# Patient Record
Sex: Female | Born: 2000 | Race: Black or African American | Hispanic: No | Marital: Single | Smoking: Never smoker
Health system: Southern US, Community
[De-identification: ages and names within clinical notes are randomized; demographics above are authoritative.]

## PROBLEM LIST (undated history)

## (undated) DIAGNOSIS — B9689 Other specified bacterial agents as the cause of diseases classified elsewhere: Secondary | ICD-10-CM

## (undated) DIAGNOSIS — L732 Hidradenitis suppurativa: Secondary | ICD-10-CM

## (undated) DIAGNOSIS — N760 Acute vaginitis: Secondary | ICD-10-CM

---

## 2018-08-31 ENCOUNTER — Encounter (HOSPITAL_COMMUNITY)
Admission: EM | Disposition: A | Discharge status: Home / Self Care | Payer: Self-pay | Source: Home / Self Care | Attending: Emergency Medicine

## 2018-08-31 ENCOUNTER — Observation Stay (HOSPITAL_COMMUNITY)
Admission: EM | Admit: 2018-08-31 | Discharge: 2018-09-01 | Disposition: A | Discharge status: Home / Self Care | Payer: BLUE CROSS/BLUE SHIELD | Attending: Orthopaedic Surgery | Admitting: Orthopaedic Surgery

## 2018-08-31 ENCOUNTER — Observation Stay (HOSPITAL_COMMUNITY): Payer: BLUE CROSS/BLUE SHIELD | Admitting: Anesthesiology

## 2018-08-31 ENCOUNTER — Other Ambulatory Visit: Payer: Self-pay

## 2018-08-31 ENCOUNTER — Emergency Department (HOSPITAL_COMMUNITY): Payer: BLUE CROSS/BLUE SHIELD

## 2018-08-31 ENCOUNTER — Encounter (HOSPITAL_COMMUNITY): Payer: Self-pay | Admitting: Emergency Medicine

## 2018-08-31 DIAGNOSIS — S86022A Laceration of left Achilles tendon, initial encounter: Secondary | ICD-10-CM | POA: Diagnosis present

## 2018-08-31 DIAGNOSIS — Y92214 College as the place of occurrence of the external cause: Secondary | ICD-10-CM | POA: Diagnosis not present

## 2018-08-31 DIAGNOSIS — S81802A Unspecified open wound, left lower leg, initial encounter: Secondary | ICD-10-CM

## 2018-08-31 DIAGNOSIS — Z23 Encounter for immunization: Secondary | ICD-10-CM | POA: Insufficient documentation

## 2018-08-31 DIAGNOSIS — S86012A Strain of left Achilles tendon, initial encounter: Secondary | ICD-10-CM | POA: Diagnosis present

## 2018-08-31 DIAGNOSIS — S86009A Unspecified injury of unspecified Achilles tendon, initial encounter: Secondary | ICD-10-CM | POA: Diagnosis present

## 2018-08-31 HISTORY — PX: WOUND EXPLORATION: SHX6188

## 2018-08-31 HISTORY — DX: Other specified bacterial agents as the cause of diseases classified elsewhere: N76.0

## 2018-08-31 HISTORY — PX: ACHILLES TENDON SURGERY: SHX542

## 2018-08-31 HISTORY — DX: Other specified bacterial agents as the cause of diseases classified elsewhere: B96.89

## 2018-08-31 HISTORY — PX: I & D EXTREMITY: SHX5045

## 2018-08-31 LAB — BASIC METABOLIC PANEL
Anion gap: 10 (ref 5–15)
BUN: 13 mg/dL (ref 6–20)
CALCIUM: 9.2 mg/dL (ref 8.9–10.3)
CO2: 23 mmol/L (ref 22–32)
Chloride: 104 mmol/L (ref 98–111)
Creatinine, Ser: 0.96 mg/dL (ref 0.44–1.00)
GFR calc Af Amer: >60 mL/min (ref >60)
GFR calc non Af Amer: >60 mL/min (ref >60)
Glucose, Bld: 104 mg/dL — ABNORMAL HIGH (ref 70–99)
Potassium: 4.1 mmol/L (ref 3.5–5.1)
Sodium: 137 mmol/L (ref 135–145)

## 2018-08-31 LAB — I-STAT BETA HCG BLOOD, ED (MC, WL, AP ONLY): I-stat hCG, quantitative: <5 m[IU]/mL (ref <5)

## 2018-08-31 LAB — CBC
HCT: 32.1 % — ABNORMAL LOW (ref 36.0–46.0)
HEMOGLOBIN: 10.1 g/dL — AB (ref 12.0–15.0)
MCH: 23.9 pg — AB (ref 26.0–34.0)
MCHC: 31.5 g/dL (ref 30.0–36.0)
MCV: 75.9 fL — ABNORMAL LOW (ref 78.0–100.0)
Platelets: 324 10*3/uL (ref 150–400)
RBC: 4.23 MIL/uL (ref 3.87–5.11)
RDW: 17.2 % — AB (ref 11.5–15.5)
WBC: 6.4 10*3/uL (ref 4.0–10.5)

## 2018-08-31 LAB — MRSA PCR SCREENING: MRSA BY PCR: NEGATIVE

## 2018-08-31 SURGERY — IRRIGATION AND DEBRIDEMENT EXTREMITY
Anesthesia: General | Laterality: Left

## 2018-08-31 MED ORDER — CEFAZOLIN SODIUM-DEXTROSE 1-4 GM/50ML-% IV SOLN
1.0000 g | Freq: Three times a day (TID) | INTRAVENOUS | Status: DC
  Administered 2018-08-31: 2 g via INTRAVENOUS

## 2018-08-31 MED ORDER — OXYCODONE HCL 5 MG PO TABS
10.0000 mg | ORAL_TABLET | ORAL | Status: DC | PRN
  Administered 2018-09-01 (×2): 10 mg via ORAL

## 2018-08-31 MED ORDER — ROCURONIUM BROMIDE 50 MG/5ML IV SOSY
PREFILLED_SYRINGE | INTRAVENOUS | Status: AC
  Filled 2018-08-31: qty 10

## 2018-08-31 MED ORDER — CHLORHEXIDINE GLUCONATE 4 % EX LIQD: 60.0000 mL | Freq: Once | CUTANEOUS | Status: DC

## 2018-08-31 MED ORDER — ONDANSETRON HCL 4 MG PO TABS: 4.0000 mg | ORAL_TABLET | Freq: Four times a day (QID) | ORAL | Status: DC | PRN

## 2018-08-31 MED ORDER — BUPIVACAINE-EPINEPHRINE (PF) 0.5% -1:200000 IJ SOLN
INTRAMUSCULAR | Status: DC | PRN
  Administered 2018-08-31: 30 mL via PERINEURAL

## 2018-08-31 MED ORDER — PROPOFOL 10 MG/ML IV BOLUS
INTRAVENOUS | Status: DC | PRN
  Administered 2018-08-31: 200 mg via INTRAVENOUS

## 2018-08-31 MED ORDER — ROCURONIUM BROMIDE 50 MG/5ML IV SOSY
PREFILLED_SYRINGE | INTRAVENOUS | Status: DC | PRN
  Administered 2018-08-31: 50 mg via INTRAVENOUS

## 2018-08-31 MED ORDER — DOCUSATE SODIUM 100 MG PO CAPS
100.0000 mg | ORAL_CAPSULE | Freq: Two times a day (BID) | ORAL | Status: DC
  Administered 2018-08-31 – 2018-09-01 (×2): 100 mg via ORAL
  Filled 2018-08-31 (×2): qty 1

## 2018-08-31 MED ORDER — ONDANSETRON HCL 4 MG/2ML IJ SOLN
INTRAMUSCULAR | Status: DC | PRN
  Administered 2018-08-31: 4 mg via INTRAVENOUS

## 2018-08-31 MED ORDER — OXYCODONE HCL 5 MG PO TABS
5.0000 mg | ORAL_TABLET | ORAL | Status: DC | PRN
  Filled 2018-08-31: qty 2

## 2018-08-31 MED ORDER — GLYCOPYRROLATE PF 0.2 MG/ML IJ SOSY
PREFILLED_SYRINGE | INTRAMUSCULAR | Status: AC
  Filled 2018-08-31: qty 2

## 2018-08-31 MED ORDER — SUGAMMADEX SODIUM 200 MG/2ML IV SOLN
INTRAVENOUS | Status: DC | PRN
  Administered 2018-08-31: 200 mg via INTRAVENOUS

## 2018-08-31 MED ORDER — FENTANYL CITRATE (PF) 250 MCG/5ML IJ SOLN
INTRAMUSCULAR | Status: AC
  Filled 2018-08-31: qty 5

## 2018-08-31 MED ORDER — DOXYCYCLINE HYCLATE 100 MG PO TABS: 100.0000 mg | ORAL_TABLET | Freq: Two times a day (BID) | ORAL | 0 refills | Status: DC

## 2018-08-31 MED ORDER — LACTATED RINGERS IV SOLN
INTRAVENOUS | Status: DC
  Administered 2018-08-31: 10:00:00 via INTRAVENOUS

## 2018-08-31 MED ORDER — CEFAZOLIN SODIUM-DEXTROSE 2-4 GM/100ML-% IV SOLN
2.0000 g | INTRAVENOUS | Status: DC
  Filled 2018-08-31: qty 100

## 2018-08-31 MED ORDER — HYDROMORPHONE HCL 1 MG/ML IJ SOLN: 0.5000 mg | INTRAMUSCULAR | Status: DC | PRN

## 2018-08-31 MED ORDER — ONDANSETRON HCL 4 MG/2ML IJ SOLN
4.0000 mg | Freq: Four times a day (QID) | INTRAMUSCULAR | Status: DC | PRN
  Administered 2018-08-31: 4 mg via INTRAVENOUS
  Filled 2018-08-31: qty 2

## 2018-08-31 MED ORDER — TRIAMCINOLONE ACETONIDE 0.1 % EX CREA
TOPICAL_CREAM | Freq: Every day | CUTANEOUS | Status: DC
  Filled 2018-08-31: qty 15

## 2018-08-31 MED ORDER — ONDANSETRON HCL 4 MG/2ML IJ SOLN
INTRAMUSCULAR | Status: AC
  Filled 2018-08-31: qty 4

## 2018-08-31 MED ORDER — MIDAZOLAM HCL 5 MG/5ML IJ SOLN
INTRAMUSCULAR | Status: DC | PRN
  Administered 2018-08-31: 2 mg via INTRAVENOUS

## 2018-08-31 MED ORDER — ENOXAPARIN SODIUM 40 MG/0.4ML ~~LOC~~ SOLN: 40.0000 mg | SUBCUTANEOUS | Status: DC

## 2018-08-31 MED ORDER — METHOCARBAMOL 1000 MG/10ML IJ SOLN: 500.0000 mg | Freq: Four times a day (QID) | INTRAVENOUS | Status: DC | PRN

## 2018-08-31 MED ORDER — SODIUM CHLORIDE 0.9 % IV SOLN
INTRAVENOUS | Status: DC
  Administered 2018-08-31: 18:00:00 via INTRAVENOUS

## 2018-08-31 MED ORDER — ACETAMINOPHEN 325 MG PO TABS: 650.0000 mg | ORAL_TABLET | Freq: Four times a day (QID) | ORAL | Status: DC | PRN

## 2018-08-31 MED ORDER — NAPROXEN 500 MG PO TABS: 500.0000 mg | ORAL_TABLET | Freq: Two times a day (BID) | ORAL | 2 refills | Status: AC | PRN

## 2018-08-31 MED ORDER — POVIDONE-IODINE 10 % EX SWAB: 2.0000 "application " | Freq: Once | CUTANEOUS | Status: DC

## 2018-08-31 MED ORDER — METHOCARBAMOL 1000 MG/10ML IJ SOLN
500.0000 mg | Freq: Four times a day (QID) | INTRAVENOUS | Status: DC | PRN
  Filled 2018-08-31: qty 5

## 2018-08-31 MED ORDER — MIDAZOLAM HCL 2 MG/2ML IJ SOLN
INTRAMUSCULAR | Status: AC
  Filled 2018-08-31: qty 2

## 2018-08-31 MED ORDER — LACTATED RINGERS IV SOLN
INTRAVENOUS | Status: DC | PRN
  Administered 2018-08-31 (×2): via INTRAVENOUS

## 2018-08-31 MED ORDER — ARTIFICIAL TEARS OPHTHALMIC OINT
TOPICAL_OINTMENT | OPHTHALMIC | Status: AC
  Filled 2018-08-31: qty 3.5

## 2018-08-31 MED ORDER — FENTANYL CITRATE (PF) 100 MCG/2ML IJ SOLN
INTRAMUSCULAR | Status: DC | PRN
  Administered 2018-08-31 (×3): 50 ug via INTRAVENOUS

## 2018-08-31 MED ORDER — SODIUM CHLORIDE 0.9 % IR SOLN
Status: DC | PRN
  Administered 2018-08-31: 1000 mL

## 2018-08-31 MED ORDER — SODIUM CHLORIDE 0.9 % IV SOLN
INTRAVENOUS | Status: DC
  Administered 2018-08-31: 06:00:00 via INTRAVENOUS

## 2018-08-31 MED ORDER — DEXAMETHASONE SODIUM PHOSPHATE 10 MG/ML IJ SOLN
INTRAMUSCULAR | Status: DC | PRN
  Administered 2018-08-31: 10 mg via INTRAVENOUS

## 2018-08-31 MED ORDER — CEFAZOLIN SODIUM-DEXTROSE 1-4 GM/50ML-% IV SOLN
1.0000 g | Freq: Four times a day (QID) | INTRAVENOUS | Status: AC
  Administered 2018-08-31 – 2018-09-01 (×3): 1 g via INTRAVENOUS
  Filled 2018-08-31 (×3): qty 50

## 2018-08-31 MED ORDER — TETANUS-DIPHTH-ACELL PERTUSSIS 5-2.5-18.5 LF-MCG/0.5 IM SUSP
0.5000 mL | Freq: Once | INTRAMUSCULAR | Status: AC
  Administered 2018-08-31: 0.5 mL via INTRAMUSCULAR
  Filled 2018-08-31: qty 0.5

## 2018-08-31 MED ORDER — METHOCARBAMOL 500 MG PO TABS: 500.0000 mg | ORAL_TABLET | Freq: Four times a day (QID) | ORAL | Status: DC | PRN

## 2018-08-31 MED ORDER — ONDANSETRON HCL 4 MG/2ML IJ SOLN: 4.0000 mg | Freq: Four times a day (QID) | INTRAMUSCULAR | Status: DC | PRN

## 2018-08-31 MED ORDER — METOCLOPRAMIDE HCL 5 MG/ML IJ SOLN: 5.0000 mg | Freq: Three times a day (TID) | INTRAMUSCULAR | Status: DC | PRN

## 2018-08-31 MED ORDER — METHOCARBAMOL 500 MG PO TABS
500.0000 mg | ORAL_TABLET | Freq: Four times a day (QID) | ORAL | Status: DC | PRN
  Administered 2018-08-31 – 2018-09-01 (×2): 500 mg via ORAL
  Filled 2018-08-31 (×2): qty 1

## 2018-08-31 MED ORDER — MORPHINE SULFATE (PF) 2 MG/ML IV SOLN: 2.0000 mg | INTRAVENOUS | Status: DC | PRN

## 2018-08-31 MED ORDER — ACETAMINOPHEN 325 MG PO TABS: 325.0000 mg | ORAL_TABLET | Freq: Four times a day (QID) | ORAL | Status: DC | PRN

## 2018-08-31 MED ORDER — ACETAMINOPHEN 650 MG RE SUPP: 650.0000 mg | Freq: Four times a day (QID) | RECTAL | Status: DC | PRN

## 2018-08-31 MED ORDER — PROPOFOL 10 MG/ML IV BOLUS
INTRAVENOUS | Status: AC
  Filled 2018-08-31: qty 20

## 2018-08-31 MED ORDER — HYDROCODONE-ACETAMINOPHEN 5-325 MG PO TABS: 1.0000 | ORAL_TABLET | ORAL | 0 refills | Status: DC | PRN

## 2018-08-31 MED ORDER — DEXAMETHASONE SODIUM PHOSPHATE 10 MG/ML IJ SOLN
INTRAMUSCULAR | Status: AC
  Filled 2018-08-31: qty 2

## 2018-08-31 MED ORDER — FENTANYL CITRATE (PF) 100 MCG/2ML IJ SOLN
50.0000 ug | INTRAMUSCULAR | Status: DC | PRN
  Administered 2018-08-31: 50 ug via INTRAVENOUS
  Filled 2018-08-31: qty 2

## 2018-08-31 MED ORDER — CEFAZOLIN SODIUM-DEXTROSE 2-4 GM/100ML-% IV SOLN
2.0000 g | Freq: Once | INTRAVENOUS | Status: AC
  Administered 2018-08-31: 2 g via INTRAVENOUS
  Filled 2018-08-31: qty 100

## 2018-08-31 MED ORDER — NEOSTIGMINE METHYLSULFATE 3 MG/3ML IV SOSY
PREFILLED_SYRINGE | INTRAVENOUS | Status: AC
  Filled 2018-08-31: qty 3

## 2018-08-31 MED ORDER — FENTANYL CITRATE (PF) 100 MCG/2ML IJ SOLN
INTRAMUSCULAR | Status: AC
  Filled 2018-08-31: qty 2

## 2018-08-31 MED ORDER — METOCLOPRAMIDE HCL 5 MG PO TABS: 5.0000 mg | ORAL_TABLET | Freq: Three times a day (TID) | ORAL | Status: DC | PRN

## 2018-08-31 MED ORDER — DIPHENHYDRAMINE HCL 12.5 MG/5ML PO ELIX: 12.5000 mg | ORAL_SOLUTION | ORAL | Status: DC | PRN

## 2018-08-31 SURGICAL SUPPLY — 62 items
BANDAGE ACE 3X5.8 VEL STRL LF (GAUZE/BANDAGES/DRESSINGS) IMPLANT
BANDAGE ACE 6X5 VEL STRL LF (GAUZE/BANDAGES/DRESSINGS) ×3 IMPLANT
BLADE SURG 10 STRL SS (BLADE) ×3 IMPLANT
BNDG COHESIVE 4X5 TAN STRL (GAUZE/BANDAGES/DRESSINGS) ×3 IMPLANT
BNDG COHESIVE 6X5 TAN STRL LF (GAUZE/BANDAGES/DRESSINGS) ×3 IMPLANT
COVER SURGICAL LIGHT HANDLE (MISCELLANEOUS) ×3 IMPLANT
CUFF TOURNIQUET SINGLE 18IN (TOURNIQUET CUFF) IMPLANT
CUFF TOURNIQUET SINGLE 34IN LL (TOURNIQUET CUFF) IMPLANT
DRAPE ORTHO SPLIT 77X108 STRL (DRAPES) ×4
DRAPE SURG 17X23 STRL (DRAPES) IMPLANT
DRAPE SURG ORHT 6 SPLT 77X108 (DRAPES) ×2 IMPLANT
DRAPE U-SHAPE 47X51 STRL (DRAPES) ×3 IMPLANT
DURAPREP 26ML APPLICATOR (WOUND CARE) ×3 IMPLANT
ELECT REM PT RETURN 9FT ADLT (ELECTROSURGICAL)
ELECTRODE REM PT RTRN 9FT ADLT (ELECTROSURGICAL) IMPLANT
GAUZE SPONGE 4X4 12PLY STRL (GAUZE/BANDAGES/DRESSINGS) IMPLANT
GAUZE SPONGE 4X4 12PLY STRL LF (GAUZE/BANDAGES/DRESSINGS) ×3 IMPLANT
GAUZE XEROFORM 1X8 LF (GAUZE/BANDAGES/DRESSINGS) IMPLANT
GAUZE XEROFORM 5X9 LF (GAUZE/BANDAGES/DRESSINGS) ×3 IMPLANT
GLOVE BIO SURGEON STRL SZ8 (GLOVE) ×3 IMPLANT
GLOVE BIOGEL PI IND STRL 6.5 (GLOVE) ×1 IMPLANT
GLOVE BIOGEL PI IND STRL 8 (GLOVE) ×2 IMPLANT
GLOVE BIOGEL PI INDICATOR 6.5 (GLOVE) ×2
GLOVE BIOGEL PI INDICATOR 8 (GLOVE) ×4
GLOVE ORTHO TXT STRL SZ7.5 (GLOVE) ×3 IMPLANT
GLOVE SURG SS PI 6.5 STRL IVOR (GLOVE) ×3 IMPLANT
GOWN STRL REUS W/ TWL LRG LVL3 (GOWN DISPOSABLE) ×2 IMPLANT
GOWN STRL REUS W/ TWL XL LVL3 (GOWN DISPOSABLE) ×2 IMPLANT
GOWN STRL REUS W/TWL LRG LVL3 (GOWN DISPOSABLE) ×4
GOWN STRL REUS W/TWL XL LVL3 (GOWN DISPOSABLE) ×4
HANDPIECE INTERPULSE COAX TIP (DISPOSABLE)
KIT BASIN OR (CUSTOM PROCEDURE TRAY) ×3 IMPLANT
KIT TURNOVER KIT B (KITS) ×3 IMPLANT
MANIFOLD NEPTUNE II (INSTRUMENTS) ×3 IMPLANT
NS IRRIG 1000ML POUR BTL (IV SOLUTION) ×3 IMPLANT
PACK ORTHO EXTREMITY (CUSTOM PROCEDURE TRAY) ×3 IMPLANT
PAD ABD 8X10 STRL (GAUZE/BANDAGES/DRESSINGS) ×6 IMPLANT
PAD ARMBOARD 7.5X6 YLW CONV (MISCELLANEOUS) ×6 IMPLANT
PADDING CAST ABS 4INX4YD NS (CAST SUPPLIES) ×2
PADDING CAST ABS COTTON 4X4 ST (CAST SUPPLIES) ×1 IMPLANT
PADDING CAST COTTON 6X4 STRL (CAST SUPPLIES) ×3 IMPLANT
SET HNDPC FAN SPRY TIP SCT (DISPOSABLE) IMPLANT
SPLINT PLASTER CAST XFAST 5X30 (CAST SUPPLIES) ×1 IMPLANT
SPLINT PLASTER XFAST SET 5X30 (CAST SUPPLIES) ×2
SPONGE LAP 18X18 X RAY DECT (DISPOSABLE) ×3 IMPLANT
STOCKINETTE IMPERVIOUS 9X36 MD (GAUZE/BANDAGES/DRESSINGS) ×3 IMPLANT
SUT ETHILON 2 0 FS 18 (SUTURE) IMPLANT
SUT ETHILON 2 0 PSLX (SUTURE) ×9 IMPLANT
SUT ETHILON 3 0 PS 1 (SUTURE) IMPLANT
SUT FIBERWIRE #2 38 T-5 BLUE (SUTURE) ×3
SUT VIC AB 0 CT1 27 (SUTURE) ×2
SUT VIC AB 0 CT1 27XBRD ANBCTR (SUTURE) ×1 IMPLANT
SUT VIC AB 1 CT1 27 (SUTURE) ×2
SUT VIC AB 1 CT1 27XBRD ANBCTR (SUTURE) ×1 IMPLANT
SUTURE FIBERWR #2 38 T-5 BLUE (SUTURE) ×1 IMPLANT
SWAB CULTURE ESWAB REG 1ML (MISCELLANEOUS) IMPLANT
TOWEL OR 17X24 6PK STRL BLUE (TOWEL DISPOSABLE) ×3 IMPLANT
TOWEL OR 17X26 10 PK STRL BLUE (TOWEL DISPOSABLE) ×3 IMPLANT
TUBE CONNECTING 12'X1/4 (SUCTIONS) ×1
TUBE CONNECTING 12X1/4 (SUCTIONS) ×2 IMPLANT
UNDERPAD 30X30 (UNDERPADS AND DIAPERS) ×3 IMPLANT
YANKAUER SUCT BULB TIP NO VENT (SUCTIONS) ×3 IMPLANT

## 2018-08-31 NOTE — Transfer of Care (Signed)
Immediate Anesthesia Transfer of Care Note  Patient: Pamela Arias  Procedure(s) Performed: IRRIGATION AND DEBRIDEMENT LEFT LOWER POSTERIOR  LEG (Left ) ACHILLES TENDON REPAIR LEFT (Left ) WOUND EXPLORATION LEFT LOWER POSTERIOR  LEG (Left )  Patient Location: PACU  Anesthesia Type:General  Level of Consciousness: awake, alert  and oriented  Airway & Oxygen Therapy: Patient Spontanous Breathing and Patient connected to nasal cannula oxygen  Post-op Assessment: Report given to RN, Post -op Vital signs reviewed and stable and Patient moving all extremities X 4  Post vital signs: Reviewed and stable  Last Vitals:  Vitals Value Taken Time  BP 115/80 08/31/2018  1:11 PM  Temp    Pulse 87 08/31/2018  1:12 PM  Resp 12 08/31/2018  1:12 PM  SpO2 100 % 08/31/2018  1:12 PM  Vitals shown include unvalidated device data.  Last Pain:  Vitals:   08/31/18 0653  TempSrc:   PainSc: Asleep         Complications: No apparent anesthesia complications

## 2018-08-31 NOTE — Progress Notes (Signed)
Patient ID: Pamela Arias, female   DOB: 10/31/2001, 17 y.o.   MRN: [id number] Dr.Chris Magnus IvanBlackman to perform surgery at 11 AM .

## 2018-08-31 NOTE — Discharge Instructions (Signed)
No weight at all on your left ankle/foot. Keep your splint clean and dry.
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
## 2018-08-31 NOTE — Anesthesia Postprocedure Evaluation (Signed)
Anesthesia Post Note  Patient: Tax inspectorMaryanne Arias  Procedure(s) Performed: IRRIGATION AND DEBRIDEMENT LEFT LOWER POSTERIOR  LEG (Left ) ACHILLES TENDON REPAIR LEFT (Left ) WOUND EXPLORATION LEFT LOWER POSTERIOR  LEG (Left )     Patient location during evaluation: PACU Anesthesia Type: General Level of consciousness: sedated Pain management: pain level controlled Vital Signs Assessment: post-procedure vital signs reviewed and stable Respiratory status: spontaneous breathing and respiratory function stable Cardiovascular status: stable Postop Assessment: no apparent nausea or vomiting Anesthetic complications: no    Last Vitals:  Vitals:   08/31/18 1410 08/31/18 1433  BP: 126/85 130/87  Pulse: 64 65  Resp: 13 16  Temp: (!) 36.4 C 36.9 C  SpO2: 100% 100%    Last Pain:  Vitals:   08/31/18 1433  TempSrc: Oral  PainSc:                  Pamela Arias

## 2018-08-31 NOTE — Progress Notes (Addendum)
0930 Received pt from ED, Left ankle dressing with small bloody drainage noted. Pt has been NPO since 1300. 0955 Pt to short stay, with family present.  1425 Receive pt back from PACU. Left lower leg with ace wrap dry and intact, toes are warm but no movement yet, numbness noted due to the regional block.

## 2018-08-31 NOTE — Anesthesia Procedure Notes (Signed)
Anesthesia Regional Block: Popliteal block   Pre-Anesthetic Checklist: ,, timeout performed, Correct Patient, Correct Site, Correct Laterality, Correct Procedure, Correct Position, site marked, Risks and benefits discussed,  Surgical consent,  Pre-op evaluation,  At surgeon's request and post-op pain management  Laterality: Left  Prep: chloraprep       Needles:  Injection technique: Single-shot  Needle Type: Echogenic Stimulator Needle          Additional Needles:   Narrative:  Start time: 08/31/2018 10:51 AM End time: 08/31/2018 11:01 AM Injection made incrementally with aspirations every 5 mL.  Performed by: Personally  Anesthesiologist: Heather RobertsSinger, Railey Glad, MD  Additional Notes: A functioning IV was confirmed and monitors were applied.  Sterile prep and drape, hand hygiene and sterile gloves were used.  Negative aspiration and test dose prior to incremental administration of local anesthetic. The patient tolerated the procedure well.Ultrasound  guidance: relevant anatomy identified, needle position confirmed, local anesthetic spread visualized around nerve(s), vascular puncture avoided.  Image printed for medical record.

## 2018-08-31 NOTE — Anesthesia Procedure Notes (Signed)
Procedure Name: Intubation Date/Time: 08/31/2018 11:43 AM Performed by: Neldon Newport, CRNA Pre-anesthesia Checklist: Timeout performed, Patient being monitored, Suction available, Emergency Drugs available and Patient identified Patient Re-evaluated:Patient Re-evaluated prior to induction Oxygen Delivery Method: Circle system utilized Preoxygenation: Pre-oxygenation with 100% oxygen Induction Type: IV induction Ventilation: Mask ventilation without difficulty Laryngoscope Size: Mac and 3 Grade View: Grade I Tube type: Oral Tube size: 7.0 mm Number of attempts: 1 Placement Confirmation: breath sounds checked- equal and bilateral,  positive ETCO2 and ETT inserted through vocal cords under direct vision Secured at: 21 cm Tube secured with: Tape Dental Injury: Teeth and Oropharynx as per pre-operative assessment
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
## 2018-08-31 NOTE — ED Notes (Signed)
Attempted report 

## 2018-08-31 NOTE — Brief Op Note (Signed)
08/31/2018  12:46 PM  PATIENT:  Carmelina NounMaryanne Cuny  17 y.o. female  PRE-OPERATIVE DIAGNOSIS:  lacerated left leg  POST-OPERATIVE DIAGNOSIS:  lacerated left leg  PROCEDURE:  Procedure(s): IRRIGATION AND DEBRIDEMENT LEFT LOWER POSTERIOR  LEG (Left) ACHILLES TENDON REPAIR LEFT (Left) WOUND EXPLORATION LEFT LOWER POSTERIOR  LEG (Left)  SURGEON:  Surgeon(s) and Role:    Kathryne Hitch* Denecia Brunette Y, MD - Primary  PHYSICIAN ASSISTANT: Rexene EdisonGil Clark, PA-C  ANESTHESIA:   regional and general  COUNTS:  YES  TOURNIQUET:  * Missing tourniquet times found for documented tourniquets in log: [id number] *  DICTATION: .Other Dictation: Dictation Number [id number]  PLAN OF CARE: Admit for overnight observation  PATIENT DISPOSITION:  PACU - hemodynamically stable.   Delay start of Pharmacological VTE agent (>24hrs) due to surgical blood loss or risk of bleeding: no

## 2018-08-31 NOTE — Progress Notes (Signed)
Patient ID: Pamela Arias, female   DOB: 12/18/2000, 17 y.o.   MRN: [id number] OR currently doing emergency surgery on child. Surgery will be delayed for a few hours. Discussed with patient surgery likely later this AM. She received pain medication and is comfortable.

## 2018-08-31 NOTE — Care Management Note (Signed)
Case Management Note  Patient Details  Name: Pamela Arias MRN: [id number] Date of Birth: 01/25/2001  Subjective/Objective:    Left traumatic open achilles tendon, I&D of ankle wound                Action/Plan: Contacted [facility] for RW for home. Waiting PT/OT recommendations for home.   Expected Discharge Date:                  Expected Discharge Plan:  Home/Self Care  In-House Referral:  NA  Discharge planning Services  CM Consult  Post Acute Care Choice:  NA Choice offered to:  NA  DME Arranged:  [place] rolling DME Agency:  [facility]  HH Arranged:    HH Agency:     Status of Service:  In process, will continue to follow  If discussed at Long Length of Stay Meetings, dates discussed:    Additional Comments:  Elliot CousinShavis, [name], RN 08/31/2018, 4:04 PM
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
## 2018-08-31 NOTE — Anesthesia Preprocedure Evaluation (Addendum)
Anesthesia Evaluation  Patient identified by MRN, date of birth, ID band Patient awake    Reviewed: Allergy & Precautions, NPO status , Patient's Chart, lab work & pertinent test results  History of Anesthesia Complications Negative for: history of anesthetic complications  Airway Mallampati: II  TM Distance: >3 FB Neck ROM: full    Dental  (+) Teeth Intact, Dental Advidsory Given   Pulmonary neg pulmonary ROS,    Pulmonary exam normal        Cardiovascular negative cardio ROS Normal cardiovascular exam     Neuro/Psych negative neurological ROS  negative psych ROS   GI/Hepatic negative GI ROS, Neg liver ROS,   Endo/Other  negative endocrine ROS  Renal/GU negative Renal ROS  negative genitourinary   Musculoskeletal negative musculoskeletal ROS (+)   Abdominal   Peds negative pediatric ROS (+)  Hematology negative hematology ROS (+)   Anesthesia Other Findings   Reproductive/Obstetrics negative OB ROS                            Anesthesia Physical Anesthesia Plan  ASA: I  Anesthesia Plan: General   Post-op Pain Management:  Regional for Post-op pain   Induction: Intravenous  PONV Risk Score and Plan: 3 and Ondansetron, Dexamethasone and Treatment may vary due to age or medical condition  Airway Management Planned: Oral ETT  Additional Equipment:   Intra-op Plan:   Post-operative Plan: Extubation in OR  Informed Consent: I have reviewed the patients History and Physical, chart, labs and discussed the procedure including the risks, benefits and alternatives for the proposed anesthesia with the patient or authorized representative who has indicated his/her understanding and acceptance.   Dental Advisory Given  Plan Discussed with: CRNA, Anesthesiologist and Surgeon  Anesthesia Plan Comments:        Anesthesia Quick Evaluation

## 2018-09-01 ENCOUNTER — Encounter (HOSPITAL_COMMUNITY): Payer: Self-pay | Admitting: Orthopaedic Surgery

## 2018-09-01 LAB — HIV ANTIBODY (ROUTINE TESTING W REFLEX): HIV SCREEN 4TH GENERATION: NONREACTIVE

## 2018-09-01 NOTE — Progress Notes (Signed)
Physical Therapy Evaluation Patient Details Name: Pamela Arias MRN: [id number] DOB: 12/28/2000 Today's Date: 09/01/2018   History of Present Illness  Pt is a 17 y.o. female s/p L achilles tendon rupture.No pertinent past medical history.  Clinical Impression  Pt admitted with above diagnosis. Pt currently with functional limitations due to deficits listed below (see PT Problem List). PTA, pt attending college and was independent with all mobility. At time of eval pt performed ambulation with gross min g to supervision with RW for safety. Pt educated on positioning for edema relief, stair negotiation, and overall safety mobilizing in community. Recommending pt utilize RW at d/c to maximize safety. Will continue to follow while admitted and progress as able per POC.      Follow Up Recommendations No PT follow up;Supervision for mobility/OOB    Equipment Recommendations  None recommended by PT    Recommendations for Other Services       Precautions / Restrictions Restrictions Weight Bearing Restrictions: Yes LLE Weight Bearing: Non weight bearing      Mobility  Bed Mobility Overal bed mobility: Modified Independent             General bed mobility comments: Pt did not require cues or assistance to sit EOB. Increased time and effort noted.   Transfers Overall transfer level: Needs assistance Equipment used: Rolling walker (2 wheeled) Transfers: Sit to/from Stand Sit to Stand: Min guard;Supervision         General transfer comment: Pt required cues for safe hand placement.  Ambulation/Gait Ambulation/Gait assistance: Min guard;Supervision(chair follow ) Gait Distance (Feet): 200 Feet Assistive device: Rolling walker (2 wheeled) Gait Pattern/deviations: Step-to pattern(hop-to pattern) Gait velocity: decreased Gait velocity interpretation: 1.31 - 2.62 ft/sec, indicative of limited community ambulator General Gait Details: Pt min g initially as this was the first  time ambulating out of room, progressing to supervision for safety. Pt requiring standing rest x2.  Pt given a chair ride to gym and back to room for energy conservation.   Stairs Stairs: Yes Stairs assistance: Min guard;Supervision Stair Management: One rail Right;One rail Left;No rails;Step to pattern;Forwards;Seated/boosting(hop to pattern) Number of Stairs: 4 General stair comments: Pt negotiated stairs with both rails with cues for LLE to be behind when ascending and in front of pt when descending. Also instructed pt on stair negotiation seated to boost self up multiple stairs as pt must get up 3 flights of stairs at discharge. Discouraged being carried up by family members. Verbally discussed utilzing RW forwards and backwards for curb and or stair negotiation in community without rails. Pt reports she will have access to an elevator at dorm.  Wheelchair Mobility    Modified Rankin (Stroke Patients Only)       Balance Overall balance assessment: Mild deficits observed, not formally tested                                           Pertinent Vitals/Pain Pain Assessment: Faces Faces Pain Scale: Hurts a little bit Pain Location: LLE Pain Descriptors / Indicators: Discomfort;Grimacing;Aching Pain Intervention(s): Limited activity within patient's tolerance;Monitored during session;Repositioned    Home Living Family/patient expects to be discharged to:: Private residence Living Arrangements: Other (Comment)(roomates) Available Help at Discharge: Friend(s);Family;Available 24 hours/day Type of Home: Apartment Home Access: Stairs to enter Entrance Stairs-Rails: Can reach both Entrance Stairs-Number of Steps: 3 flights Home Layout: One level Home Equipment: None  Additional Comments: Pt staying with brother over the weekend with plans to go back to college next week. Dorm is handicap accessible.     Prior Function Level of Independence: Independent                Hand Dominance        Extremity/Trunk Assessment   Upper Extremity Assessment Upper Extremity Assessment: Overall WFL for tasks assessed    Lower Extremity Assessment Lower Extremity Assessment: LLE deficits/detail LLE Deficits / Details: s/p achilles tendon injury; able to maintain NWB LLE independently     Cervical / Trunk Assessment Cervical / Trunk Assessment: Normal  Communication   Communication: No difficulties  Cognition Arousal/Alertness: Awake/alert Behavior During Therapy: Impulsive Overall Cognitive Status: Within Functional Limits for tasks assessed                                 General Comments: Pt slightly impulsive to perform activites before recieving all instructions.      General Comments      Exercises     Assessment/Plan    PT Assessment Patient needs continued PT services  PT Problem List Decreased strength;Decreased range of motion;Decreased activity tolerance;Decreased balance;Decreased mobility;Decreased coordination;Decreased knowledge of use of DME;Decreased safety awareness;Decreased knowledge of precautions;Pain       PT Treatment Interventions DME instruction;Gait training;Stair training;Functional mobility training;Therapeutic activities;Therapeutic exercise;Balance training;Patient/family education;Modalities    PT Goals (Current goals can be found in the Care Plan section)  Acute Rehab PT Goals Patient Stated Goal: to go home  PT Goal Formulation: With patient Time For Goal Achievement: 09/08/18 Potential to Achieve Goals: Good    Frequency Min 2X/week   Barriers to discharge        Co-evaluation               AM-PAC PT "6 Clicks" Daily Activity  Outcome Measure Difficulty turning over in bed (including adjusting bedclothes, sheets and blankets)?: None Difficulty moving from lying on back to sitting on the side of the bed? : A Little Difficulty sitting down on and standing up from a chair with  arms (e.g., wheelchair, bedside commode, etc,.)?: A Little Help needed moving to and from a bed to chair (including a wheelchair)?: A Little Help needed walking in hospital room?: A Little Help needed climbing 3-5 steps with a railing? : A Little 6 Click Score: 19    End of Session Equipment Utilized During Treatment: Gait belt Activity Tolerance: Patient tolerated treatment well Patient left: in chair;with call bell/phone within reach;with family/visitor present Nurse Communication: Mobility status PT Visit Diagnosis: Other abnormalities of gait and mobility (R26.89);Pain Pain - Right/Left: Left Pain - part of body: Leg    Time: 1610-9604 PT Time Calculation (min) (ACUTE ONLY): 26 min   Charges:   PT Evaluation $PT Eval Low Complexity: 1 Low PT Treatments $Gait Training: 8-22 mins        Donzetta Kohut, [place]  Student Physical Therapist Acute Rehab [phone]   Donzetta Kohut 09/01/2018, 10:58 AM

## 2018-09-01 NOTE — Progress Notes (Signed)
Pt is ambulating with walker to the hall. Maint NWB to LLE. Left lower leg ace wrap dry and intact. Pain is controlled with oral narcotics. Discharge instructions given to pt, verbalized understanding. Discharged to home accompanied by her brother.

## 2018-09-01 NOTE — Plan of Care (Signed)
  Problem: Pain Managment: Goal: General experience of comfort will improve Outcome: Progressing   

## 2018-09-01 NOTE — Progress Notes (Signed)
   09/01/18 1057  PT General Charges  $$ ACUTE PT VISIT 1 Visit  PT Evaluation  $PT Eval Low Complexity 1 Low  PT Treatments  $Gait Training 8-22 mins  Deborah ChalkJennifer Jahanna Raether, [place]PT, DPT  Acute Rehabilitation Services Pager [phone] Office [phone]
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
## 2018-09-01 NOTE — Progress Notes (Signed)
Patient ID: Pamela NounMaryanne Arias, female   DOB: 12/06/2001, 17 y.o.   MRN: [id number] No acute changes.  Left ankle splint clean and intact. Can be discharged to home today.

## 2018-09-04 ENCOUNTER — Telehealth (INDEPENDENT_AMBULATORY_CARE_PROVIDER_SITE_OTHER): Payer: Self-pay | Admitting: Orthopaedic Surgery

## 2018-09-04 ENCOUNTER — Encounter (INDEPENDENT_AMBULATORY_CARE_PROVIDER_SITE_OTHER): Payer: Self-pay

## 2018-09-04 ENCOUNTER — Ambulatory Visit (INDEPENDENT_AMBULATORY_CARE_PROVIDER_SITE_OTHER): Payer: BLUE CROSS/BLUE SHIELD | Admitting: Orthopaedic Surgery

## 2018-09-04 DIAGNOSIS — S86002D Unspecified injury of left Achilles tendon, subsequent encounter: Secondary | ICD-10-CM | POA: Diagnosis not present

## 2018-09-04 NOTE — Telephone Encounter (Signed)
Patient father Pamela Arias called needing a note for patient for school Patient had surgery and will need this excuse written as soon as possible.  Please call father to advise [phone]

## 2018-09-04 NOTE — Telephone Encounter (Signed)
Patients dad aware this is ready for them at the front desk

## 2018-09-13 ENCOUNTER — Ambulatory Visit (INDEPENDENT_AMBULATORY_CARE_PROVIDER_SITE_OTHER): Payer: BLUE CROSS/BLUE SHIELD | Admitting: Orthopaedic Surgery

## 2018-09-13 ENCOUNTER — Encounter (INDEPENDENT_AMBULATORY_CARE_PROVIDER_SITE_OTHER): Payer: Self-pay | Admitting: Orthopaedic Surgery

## 2018-09-13 DIAGNOSIS — S86002D Unspecified injury of left Achilles tendon, subsequent encounter: Secondary | ICD-10-CM | POA: Diagnosis not present

## 2018-09-13 NOTE — Progress Notes (Signed)
The patient is now 2 weeks status post irrigation debridement and closure of a traumatic open Achilles injury to her left foot and ankle.  She is doing well overall.  On exam I removed all the sutures.  There is some slight gapping at the traumatic wound that she had there was a transverse wound.  Overall her foot is well-perfused with normal sensation.  I did place Steri-Strips and a new dressing.  On acute distress and on for the next week before removing it and try to get things wet in shower.  I would like to see her back in 2 weeks for wound check.  Today we will put her in a cam walking boot with a 2 he will build up but she is not to put weight on her foot or ankle at all until we give her clearance for that.  She will continue crutches for now.

## 2018-09-27 ENCOUNTER — Encounter (INDEPENDENT_AMBULATORY_CARE_PROVIDER_SITE_OTHER): Payer: Self-pay

## 2018-09-27 ENCOUNTER — Ambulatory Visit (INDEPENDENT_AMBULATORY_CARE_PROVIDER_SITE_OTHER): Payer: BLUE CROSS/BLUE SHIELD | Admitting: Orthopaedic Surgery

## 2018-09-27 ENCOUNTER — Telehealth (INDEPENDENT_AMBULATORY_CARE_PROVIDER_SITE_OTHER): Payer: Self-pay

## 2018-09-27 NOTE — Telephone Encounter (Signed)
Patient called wanting to know what can she clean or use for her left achilles/ankle.  Stated that it doesn't hurt, no redness, no drainage, just a little swelling.  CB# is [phone].  Please advise.    R/S appt.for Monday, 10/02/2018.

## 2018-09-27 NOTE — Telephone Encounter (Signed)
See below

## 2018-09-27 NOTE — Telephone Encounter (Signed)
Try just a little bit of hydrogen peroxide to dab on the wound once daily and then place a small amount of triple antibiotic ointment on the would daily.  Also, she can get it wet once daily in the shower.  We do need to see her soon in follow-up.

## 2018-09-28 NOTE — Telephone Encounter (Signed)
LMOM of the below message for patient  

## 2018-10-02 ENCOUNTER — Encounter (INDEPENDENT_AMBULATORY_CARE_PROVIDER_SITE_OTHER): Payer: Self-pay | Admitting: Physician Assistant

## 2018-10-02 ENCOUNTER — Ambulatory Visit (INDEPENDENT_AMBULATORY_CARE_PROVIDER_SITE_OTHER): Payer: BLUE CROSS/BLUE SHIELD | Admitting: Physician Assistant

## 2018-10-02 VITALS — Ht 67.0 in | Wt 155.0 lb

## 2018-10-02 DIAGNOSIS — S86012A Strain of left Achilles tendon, initial encounter: Secondary | ICD-10-CM

## 2018-10-02 NOTE — Progress Notes (Signed)
HPI: Pamela Arias returns today now 4 weeks status post irrigation debridement left lower posterior leg wound and Achilles tendon repair.  She is overall doing well.  No fevers chills.  No real pain.  She is nonweightbearing with crutch.  Physical exam: Left calf supple nontender.  Surgical incisions healing well.  Laceration horizontal over the calcaneus posteriorly is overall healing well there is no signs of dehiscence.  Some slight bleeding.  No signs of gross infection.  Surgical incision is healing well.  Thompson's test reveals the Achilles to be intact.  Impression: 2 weeks status post irrigation debridement left lower leg wound and repair of Achilles tendon  Plan: Elevation wiggling toes encouraged.  Scar tissue mobilization encouraged.  No soaking of the foot however she is to get the incision wet in shower and wash with antibacterial soap.  Sterile strips applied to the laceration region.  She is partial weightbearing the cam walker boot with 2 heel lifts for the next 2 weeks then she can go full weightbearing with 2 heel lifts over the next 2 weeks.  We will see her back in a month check her progress lack of.  See her sooner if there is any questions concerns or signs of infection.

## 2018-11-02 ENCOUNTER — Ambulatory Visit (INDEPENDENT_AMBULATORY_CARE_PROVIDER_SITE_OTHER): Payer: BLUE CROSS/BLUE SHIELD | Admitting: Physician Assistant

## 2018-11-02 ENCOUNTER — Encounter (INDEPENDENT_AMBULATORY_CARE_PROVIDER_SITE_OTHER): Payer: Self-pay | Admitting: Physician Assistant

## 2018-11-02 DIAGNOSIS — S86012A Strain of left Achilles tendon, initial encounter: Secondary | ICD-10-CM

## 2018-11-02 NOTE — Progress Notes (Signed)
HPI: Pamela Arias returns now 2 months status post left Achilles tendon rupture repair.  She is overall doing well does not have much pain.  She has some soreness at times.  She continues to walk in a cam walker boot with the 2 lifts.  She has no complaints.  Physical exam: Left Achilles intact with minimal tenderness.  Thompson test is negative.  Surgical incision healing with some keloid formation.  There is no wound breakdown or impending ulcers.  She has good range of motion of the ankle with dorsiflexion plantarflexion without pain.  Calf supple nontender.  Impression: 2 months status post repair of left Achilles rupture  Plan: We will place her weightbearing as tolerated in the boot without the left for 2 weeks.  Then she can transition to a regular shoe without a lift.  No high impact activities no sports at this time.  We will see her back in a month.  Continue to work on scar tissue mobilization.

## 2018-12-13 HISTORY — PX: OTHER SURGICAL HISTORY: SHX169

## 2019-01-08 ENCOUNTER — Other Ambulatory Visit (INDEPENDENT_AMBULATORY_CARE_PROVIDER_SITE_OTHER): Payer: Self-pay

## 2019-01-08 ENCOUNTER — Ambulatory Visit (INDEPENDENT_AMBULATORY_CARE_PROVIDER_SITE_OTHER): Payer: BLUE CROSS/BLUE SHIELD | Admitting: Orthopaedic Surgery

## 2019-01-08 ENCOUNTER — Encounter (INDEPENDENT_AMBULATORY_CARE_PROVIDER_SITE_OTHER): Payer: Self-pay | Admitting: Orthopaedic Surgery

## 2019-01-08 DIAGNOSIS — S86002D Unspecified injury of left Achilles tendon, subsequent encounter: Secondary | ICD-10-CM | POA: Diagnosis not present

## 2019-01-08 DIAGNOSIS — S86012A Strain of left Achilles tendon, initial encounter: Secondary | ICD-10-CM

## 2019-01-08 NOTE — Progress Notes (Signed)
The patient is now 4 months status post a traumatic open Achilles tendon rupture of her left ankle.  She is a Consulting civil engineer at [employer].  He is ambulating without any type of assistive device.  She does report ankle stiffness.  On exam her incision is well-healed but there is a large keloid.  She has significant weakness with plantarflexion of the foot but the Achilles repair is intact on the left side.  She cannot go up on her toes yet on that side.  At this point I think she would benefit from outpatient physical therapy to work on her proprioception and balance and ankle strengthening.  They can try to set this up between her classes.  I did give her a note to allow her time to get to classes as well.  We can see her back in about 3 months to make sure she is doing well from a strength standpoint.  All question concerns were answered and addressed.

## 2019-01-18 ENCOUNTER — Ambulatory Visit: Payer: BLUE CROSS/BLUE SHIELD | Attending: Orthopaedic Surgery | Admitting: Physical Therapy

## 2019-01-18 DIAGNOSIS — M25572 Pain in left ankle and joints of left foot: Secondary | ICD-10-CM | POA: Diagnosis present

## 2019-01-18 DIAGNOSIS — R2689 Other abnormalities of gait and mobility: Secondary | ICD-10-CM | POA: Diagnosis present

## 2019-01-18 DIAGNOSIS — M25672 Stiffness of left ankle, not elsewhere classified: Secondary | ICD-10-CM

## 2019-01-19 NOTE — Therapy (Addendum)
[place], [place], [address] Phone: [phone]   Fax:  [phone]  Physical Therapy Evaluation/Discharge   Patient Details  Name: Pamela Arias MRN: [id number] Date of Birth: November 29, 2001 Referring Provider (PT): Dr Jean Rosenthal MD    Encounter Date: 01/18/2019  PT End of Session - 01/18/19 1643    Visit Number  1    Number of Visits  12    Date for PT Re-Evaluation  03/01/19    Authorization Type  [employer]     PT Start Time  1630    PT Stop Time  1710    PT Time Calculation (min)  40 min    Activity Tolerance  Patient tolerated treatment well    Behavior During Therapy  [facility] for tasks assessed/performed       Past Medical History:  Diagnosis Date  . Bacterial vaginosis     Past Surgical History:  Procedure Laterality Date  . ACHILLES TENDON SURGERY Left 08/31/2018   IRRIGATION AND DEBRIDEMENT LEFT LOWER POSTERIOR  LEG (Left)  . ACHILLES TENDON SURGERY Left 08/31/2018   Procedure: ACHILLES TENDON REPAIR LEFT;  Surgeon: Mcarthur Rossetti, MD;  Location: [place];  Service: Orthopedics;  Laterality: Left;  . I&D EXTREMITY Left 08/31/2018   Procedure: IRRIGATION AND DEBRIDEMENT LEFT LOWER POSTERIOR  LEG;  Surgeon: Mcarthur Rossetti, MD;  Location: [place];  Service: Orthopedics;  Laterality: Left;  . WOUND EXPLORATION Left 08/31/2018   Procedure: WOUND EXPLORATION LEFT LOWER POSTERIOR  LEG;  Surgeon: Mcarthur Rossetti, MD;  Location: [place];  Service: Orthopedics;  Laterality: Left;    There were no vitals filed for this visit.   Subjective Assessment - 01/18/19 1633    Subjective  Patient had a left Achillies tear and repair on 09/01/2019. Since that poiint she reports some stiffness if she sits for too long. She feesl like she is having difficulty going up on her toes. She     Limitations  Walking    How long can you sit comfortably?  becomes stiff when she sits      Diagnostic tests  Nothing Post-op     Patient Stated Goals  Go up on her toes.     Currently in Pain?  No/denies         [facility] PT Assessment - 01/19/19 0001      Assessment   Medical Diagnosis  Left Achilllies tear and repair     Referring Provider (PT)  Dr Jean Rosenthal MD     Onset Date/Surgical Date  09/01/19    Next MD Visit  3 months     Prior Therapy  None       Precautions   Precautions  None      Restrictions   Weight Bearing Restrictions  No      Balance Screen   Has the patient fallen in the past 6 months  No    Has the patient had a decrease in activity level because of a fear of falling?   No    Is the patient reluctant to leave their home because of a fear of falling?   No      Home Environment   Additional Comments  Has an elevator into her house.       Prior Function   Level of Independence  Independent    Vocation  Student    Vocation Requirements  Has to walk on campus  Leisure  wearing heels      Cognition   Overall Cognitive Status  Within Functional Limits for tasks assessed    Attention  Focused    Focused Attention  Appears intact    Memory  Appears intact    Awareness  Appears intact    Problem Solving  Appears intact      Observation/Other Assessments   Focus on Therapeutic Outcomes (FOTO)   41% limitation       Sensation   Light Touch  Appears Intact    Additional Comments  Numbess in the incision area       Coordination   Gross Motor Movements are Fluid and Coordinated  Yes    Fine Motor Movements are Fluid and Coordinated  Yes      Functional Tests   Functional tests  Single leg stance      Single Leg Stance   Comments  decreased stability L V R       AROM   Right Ankle Dorsiflexion  20    Left Ankle Dorsiflexion  5      PROM   Right Ankle Dorsiflexion  20    Left Ankle Dorsiflexion  10      Strength   Left Ankle Dorsiflexion  5/5    Left Ankle Plantar Flexion  4/5    Left Ankle Inversion  5/5    Left  Ankle Eversion  5/5      Palpation   Palpation comment  Scar tissue on the lateral schillies insertion       Ambulation/Gait   Gait Comments  mild decrease in single leg stance on the right                 Objective measurements completed on examination: See above findings.              PT Education - 01/18/19 1642    Education Details  HEP; symptom mangement; activity progression     Person(s) Educated  Patient    Methods  Explanation;Demonstration;Tactile cues;Verbal cues    Comprehension  Verbalized understanding;Returned demonstration;Verbal cues required;Tactile cues required;Need further instruction       PT Short Term Goals - 01/19/19 1443      PT SHORT TERM GOAL #1   Title  Patient will demonstrate 15 degrees of active DF     Time  3    Period  Weeks    Status  New    Target Date  02/09/19      PT SHORT TERM GOAL #2   Title  Patient will demostrate a 30 sec single leg stance     Time  3    Period  Weeks    Status  New    Target Date  02/09/19        PT Long Term Goals - 01/18/19 1640      PT LONG TERM GOAL #1   Title  Patient will report no pain when walking fast     Time  6    Period  Weeks    Status  New    Target Date  03/01/19      PT LONG TERM GOAL #2   Title  Patient will go up on her toes without dififuclty in order to reach shelves     Time  6    Period  Weeks    Status  New    Target Date  03/01/19      PT LONG  TERM GOAL #3   Title  Patient will wear heels without pain     Time  6    Period  Weeks    Status  New    Target Date  03/01/19             Plan - 01/19/19 1432    Clinical Impression Statement  Patient is a 18 year old female with S/P left achillies repair on 09/01/2019. Since that point sheis having very little pain. She is just having stiffness at times. She cannot wear heels and reports she can not go up on her toes. She has minor PF  weakness and 8 degrees of active dorsi-flexion compared to 20 on  the left. She reports she is already back to running and has been for some time. She was given an exercise program. Therapy will continueto work on PF strengthening and progress her into a home exercise program.  She also has scar tissue at the icision site. Therapy will work on decreasing scar tissue.     Clinical Presentation  Stable    Clinical Decision Making  Low    Rehab Potential  Good    PT Frequency  1x / week    PT Duration  4 weeks    PT Treatment/Interventions  ADLs/Self Care Home Management;Cryotherapy;Moist Heat;Ultrasound;DME Instruction;Gait training;Stair training;Functional mobility training;Therapeutic activities;Therapeutic exercise;Neuromuscular re-education;Patient/family education;Manual techniques;Splinting    PT Next Visit Plan  advance strength training; consder baps board; single leg stnce: rebounder d2 kettle bell; leg press; heel raise; single leg heel raise; single leg squat ; Scar tissue; mobilizatio; stair training     PT Home Exercise Plan  heel raise; DF with band;  cone drill    Consulted and Agree with Plan of Care  Patient       Patient will benefit from skilled therapeutic intervention in order to improve the following deficits and impairments:  Pain, Decreased activity tolerance, Decreased range of motion, Decreased mobility, Decreased scar mobility  Visit Diagnosis: Stiffness of left ankle, not elsewhere classified  Pain in left ankle and joints of left foot  Other abnormalities of gait and mobility   PHYSICAL THERAPY DISCHARGE SUMMARY  Visits from Start of Care: 1  Current functional level related to goals / functional outcomes: did not return for follow up    Remaining deficits: Unknown    Education / Equipment: HEP  Plan: Patient agrees to discharge.  Patient goals were not met. Patient is being discharged due to not returning since the last visit.  ?????       Problem List Patient Active Problem List   Diagnosis Date Noted  .  Achilles tendon injury 08/31/2018  . Achilles rupture, left, initial encounter 08/31/2018    Carney Living PT DPT  01/19/2019, 2:56 PM   [facility] [address] [place], [place], [address] Phone: [phone]   Fax:  [phone]  Name: Pamela Arias MRN: [id number] Date of Birth: [birth year]

## 2019-01-19 NOTE — Patient Instructions (Signed)
Heel raise;  Ankle PF with green band Single leg stance with rotation

## 2019-01-23 ENCOUNTER — Encounter: Payer: BLUE CROSS/BLUE SHIELD | Admitting: Physical Therapy

## 2019-01-24 ENCOUNTER — Encounter: Payer: BLUE CROSS/BLUE SHIELD | Admitting: Physical Therapy

## 2019-01-30 ENCOUNTER — Ambulatory Visit: Payer: BLUE CROSS/BLUE SHIELD | Admitting: Physical Therapy

## 2019-01-30 ENCOUNTER — Telehealth: Payer: Self-pay | Admitting: Physical Therapy

## 2019-01-30 NOTE — Telephone Encounter (Signed)
Called patient about missed visit.  Phone stopped ringing and then I heard  background noise.  I spoke , however there was no answer and I was unable to leave a message. Liz Beach PTA

## 2019-02-01 ENCOUNTER — Ambulatory Visit: Payer: BLUE CROSS/BLUE SHIELD | Admitting: Physical Therapy

## 2019-02-06 ENCOUNTER — Ambulatory Visit: Payer: BLUE CROSS/BLUE SHIELD | Admitting: Physical Therapy

## 2019-02-14 ENCOUNTER — Encounter: Payer: BLUE CROSS/BLUE SHIELD | Admitting: Physical Therapy

## 2019-02-14 ENCOUNTER — Ambulatory Visit: Payer: BLUE CROSS/BLUE SHIELD | Attending: Orthopaedic Surgery | Admitting: Physical Therapy

## 2019-02-20 ENCOUNTER — Ambulatory Visit: Payer: BLUE CROSS/BLUE SHIELD | Admitting: Physical Therapy

## 2019-04-04 ENCOUNTER — Ambulatory Visit: Payer: Self-pay | Admitting: *Deleted

## 2019-04-04 NOTE — Telephone Encounter (Signed)
Patient has hemorrhoids, small outpouches she describes. No pain/burning/itching/bleeding at this time. No fever/abdominal pain/rash reported. Has had chronic hard stools for a long time with having to strain. Reviewed sitz bath instructions and the use of OTC prep H, externally. Recommended increased fiber and water with her diet. Reviewed symptoms to call back for. Stated she understood.   Reason for Disposition . Mild rectal pain  Answer Assessment - Initial Assessment Questions 1. SYMPTOM:  "What's the main symptom you're concerned about?" (e.g., pain, itching, swelling, rash)     hemorrhoids  2. ONSET: "When did the *No Answer*  start?"     She began having them years ago. 3. RECTAL PAIN: "Do you have any pain around your rectum?" "How bad is the pain?"  (Scale 1-10; or mild, moderate, severe)  - MILD (1-3): doesn't interfere with normal activities   - MODERATE (4-7): interferes with normal activities or awakens from sleep, limping   - SEVERE (8-10): excruciating pain, unable to have a bowel movement     none 4. RECTAL ITCHING: "Do you have any itching in this area?" "How bad is the itching?"  (Scale 1-10; or mild, moderate, severe)  - MILD - doesn't interfere with normal activities   - MODERATE - SEVERE: interferes with normal activities or awakens from sleep     none 5. CONSTIPATION: "Do you have constipation?" If so, "How bad is it?"     yes 6. CAUSE: "What do you think is causing the anus symptoms?"     straining 7. OTHER SYMPTOMS: "Do you have any other symptoms?"  (e.g., rectal bleeding, abdominal pain, vomiting, fever)     none 8. PREGNANCY: "Is there any chance you are pregnant?" "When was your last menstrual period?"  Protocols used: RECTAL [facility]

## 2019-04-09 ENCOUNTER — Ambulatory Visit (INDEPENDENT_AMBULATORY_CARE_PROVIDER_SITE_OTHER): Payer: BLUE CROSS/BLUE SHIELD | Admitting: Orthopaedic Surgery
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
## 2020-12-04 ENCOUNTER — Other Ambulatory Visit: Payer: Self-pay

## 2020-12-04 ENCOUNTER — Ambulatory Visit (HOSPITAL_COMMUNITY)
Admission: EM | Admit: 2020-12-04 | Discharge: 2020-12-04 | Disposition: A | Discharge status: Home / Self Care | Payer: BC Managed Care – PPO | Attending: Family Medicine | Admitting: Family Medicine

## 2020-12-04 ENCOUNTER — Encounter (HOSPITAL_COMMUNITY): Payer: Self-pay

## 2020-12-04 DIAGNOSIS — Z113 Encounter for screening for infections with a predominantly sexual mode of transmission: Secondary | ICD-10-CM | POA: Diagnosis present

## 2020-12-04 DIAGNOSIS — Z3202 Encounter for pregnancy test, result negative: Secondary | ICD-10-CM | POA: Diagnosis not present

## 2020-12-04 DIAGNOSIS — N898 Other specified noninflammatory disorders of vagina: Secondary | ICD-10-CM | POA: Insufficient documentation

## 2020-12-04 LAB — POCT URINALYSIS DIPSTICK, ED / UC
Bilirubin Urine: NEGATIVE
Glucose, UA: NEGATIVE mg/dL
Ketones, ur: NEGATIVE mg/dL
Leukocytes,Ua: NEGATIVE
Nitrite: NEGATIVE
Protein, ur: NEGATIVE mg/dL
Specific Gravity, Urine: 1.025 (ref 1.005–1.030)
Urobilinogen, UA: 0.2 mg/dL (ref 0.0–1.0)
pH: 5.5 (ref 5.0–8.0)

## 2020-12-04 LAB — POC URINE PREG, ED: Preg Test, Ur: NEGATIVE

## 2020-12-04 NOTE — ED Provider Notes (Signed)
[facility]    CSN: [id number] Arrival date & time: 12/04/20  1115      History   Chief Complaint Chief Complaint  Patient presents with  . Abdominal Pain  . Vaginal Discharge  . Leg Injury    HPI Pamela Arias is a 19 y.o. female.   Here today with white vaginal discharge and suprapubic abdominal cramping for about a week now. Denies dysuria, hematuria, flank pain, fever, chills, N/V. Has a new sexual partner and requesting STI testing. Not trying anything OTC for sxs.      Past Medical History:  Diagnosis Date  . Bacterial vaginosis     Patient Active Problem List   Diagnosis Date Noted  . Achilles tendon injury 08/31/2018  . Achilles rupture, left, initial encounter 08/31/2018    Past Surgical History:  Procedure Laterality Date  . ACHILLES TENDON SURGERY Left 08/31/2018   IRRIGATION AND DEBRIDEMENT LEFT LOWER POSTERIOR  LEG (Left)  . ACHILLES TENDON SURGERY Left 08/31/2018   Procedure: ACHILLES TENDON REPAIR LEFT;  Surgeon: Kathryne Hitch, MD;  Location: [facility] OR;  Service: Orthopedics;  Laterality: Left;  . I & D EXTREMITY Left 08/31/2018   Procedure: IRRIGATION AND DEBRIDEMENT LEFT LOWER POSTERIOR  LEG;  Surgeon: Kathryne Hitch, MD;  Location[facility];  Service: Orthopedics;  Laterality: Left;  . WOUND EXPLORATION Left 08/31/2018   Procedure: WOUND EXPLORATION LEFT LOWER POSTERIOR  LEG;  Surgeon: Kathryne Hitch, MD;  Location[facility];  Service: Orthopedics;  Laterality: Left;    OB History   No obstetric history on file.      Home Medications    Prior to Admission medications   Medication Sig Start Date End Date Taking? Authorizing Provider  betamethasone valerate (VALISONE) 0.1 % cream Apply 1 application topically daily.    [provider]  doxycycline (VIBRA-TABS) 100 MG tablet Take 1 tablet (100 mg total) by mouth 2 (two) times daily. Patient not taking: Reported on 11/02/2018 08/31/18   Kathryne Hitch, MD  HYDROcodone-acetaminophen (NORCO/VICODIN) 5-325 MG tablet Take 1-2 tablets by mouth every 4 (four) hours as needed for moderate pain. Patient not taking: Reported on 10/02/2018 08/31/18   Kathryne Hitch, MD    Family History Family History  Problem Relation Age of Onset  . Rheum arthritis Mother     Social History Social History   Tobacco Use  . Smoking status: Never Smoker  . Smokeless tobacco: Never Used  Vaping Use  . Vaping Use: Never used  Substance Use Topics  . Alcohol use: Not Currently  . Drug use: Never     Allergies   Patient has no known allergies.   Review of Systems Review of Systems PER HPI    Physical Exam Triage Vital Signs ED Triage Vitals  Enc Vitals Group     BP 12/04/20 1233 131/87     Pulse Rate 12/04/20 1233 67     Resp 12/04/20 1233 18     Temp 12/04/20 1233 99 F (37.2 C)     Temp Source 12/04/20 1233 Oral     SpO2 12/04/20 1233 99 %     Weight --      Height --      Head Circumference --      Peak Flow --      Pain Score 12/04/20 1231 2     Pain Loc --      Pain Edu? --      Excl. in GC? --  No data found.  Updated Vital Signs BP 131/87 (BP Location: Right Arm)   Pulse 67   Temp 99 F (37.2 C) (Oral)   Resp 18   LMP 12/03/2020 (Exact Date)   SpO2 99%   Visual Acuity Right Eye Distance:   Left Eye Distance:   Bilateral Distance:    Right Eye Near:   Left Eye Near:    Bilateral Near:     Physical Exam Vitals and nursing note reviewed.  Constitutional:      Appearance: Normal appearance. She is not ill-appearing.  HENT:     Head: Atraumatic.  Eyes:     Extraocular Movements: Extraocular movements intact.     Conjunctiva/sclera: Conjunctivae normal.  Cardiovascular:     Rate and Rhythm: Normal rate and regular rhythm.     Heart sounds: Normal heart sounds.  Pulmonary:     Effort: Pulmonary effort is normal.     Breath sounds: Normal breath sounds.  Abdominal:     General:  Bowel sounds are normal. There is no distension.     Palpations: Abdomen is soft.     Tenderness: There is no abdominal tenderness. There is no right CVA tenderness, left CVA tenderness or guarding.  Genitourinary:    Comments: GU exam deferred, self swab performed Musculoskeletal:        General: Normal range of motion.     Cervical back: Normal range of motion and neck supple.  Skin:    General: Skin is warm and dry.  Neurological:     Mental Status: She is alert and oriented to person, place, and time.  Psychiatric:        Mood and Affect: Mood normal.        Thought Content: Thought content normal.        Judgment: Judgment normal.     UC Treatments / Results  Labs (all labs ordered are listed, but only abnormal results are displayed) Labs Reviewed  POCT URINALYSIS DIPSTICK, ED / UC - Abnormal; Notable for the following components:      Result Value   Hgb urine dipstick LARGE (*)    All other components within normal limits  POC URINE PREG, ED  CERVICOVAGINAL ANCILLARY ONLY    EKG   Radiology No results found.  Procedures Procedures (including critical care time)  Medications Ordered in UC Medications - No data to display  Initial Impression / Assessment and Plan / UC Course  I have reviewed the triage vital signs and the nursing notes.  Pertinent labs & imaging results that were available during my care of the patient were reviewed by me and considered in my medical decision making (see chart for details).     U/A and urine preg negative, aptima swab pending. Discussed abstinence until results return, safe sexual practices, good vaginal hygiene. Treat as needed based on swab results.   Final Clinical Impressions(s) / UC Diagnoses   Final diagnoses:  Vaginal discharge  Routine screening for STI (sexually transmitted infection)   Discharge Instructions   None    ED Prescriptions    None     PDMP not reviewed this encounter.   Particia Nearing, [place] 12/04/20 1316

## 2020-12-04 NOTE — ED Triage Notes (Signed)
Pt in c/o white vaginal discharge and lower abdominal pain that has been going on for 1 week now.  Requesting STD testing

## 2020-12-08 ENCOUNTER — Telehealth (HOSPITAL_COMMUNITY): Payer: Self-pay | Admitting: Emergency Medicine

## 2020-12-08 LAB — CERVICOVAGINAL ANCILLARY ONLY
Bacterial Vaginitis (gardnerella): POSITIVE — AB
Candida Glabrata: NEGATIVE
Candida Vaginitis: NEGATIVE
Chlamydia: NEGATIVE
Comment: NEGATIVE
Comment: NEGATIVE
Comment: NEGATIVE
Comment: NEGATIVE
Comment: NEGATIVE
Comment: NORMAL
Neisseria Gonorrhea: NEGATIVE
Trichomonas: NEGATIVE

## 2020-12-08 MED ORDER — METRONIDAZOLE 500 MG PO TABS: 500.0000 mg | ORAL_TABLET | Freq: Two times a day (BID) | ORAL | 0 refills | Status: DC

## 2020-12-15 ENCOUNTER — Ambulatory Visit: Payer: BLUE CROSS/BLUE SHIELD

## 2020-12-18 ENCOUNTER — Ambulatory Visit: Payer: BC Managed Care – PPO

## 2020-12-29 ENCOUNTER — Other Ambulatory Visit (HOSPITAL_COMMUNITY)
Admission: RE | Admit: 2020-12-29 | Discharge: 2020-12-29 | Disposition: A | Discharge status: Home / Self Care | Payer: BC Managed Care – PPO | Source: Ambulatory Visit | Attending: Surgery | Admitting: Surgery

## 2020-12-29 DIAGNOSIS — Z20822 Contact with and (suspected) exposure to covid-19: Secondary | ICD-10-CM | POA: Diagnosis not present

## 2020-12-29 DIAGNOSIS — Z01812 Encounter for preprocedural laboratory examination: Secondary | ICD-10-CM | POA: Insufficient documentation

## 2020-12-29 LAB — SARS CORONAVIRUS 2 (TAT 6-24 HRS): SARS Coronavirus 2: NEGATIVE

## 2021-01-10 ENCOUNTER — Other Ambulatory Visit (HOSPITAL_COMMUNITY)
Admission: RE | Admit: 2021-01-10 | Discharge: 2021-01-10 | Disposition: A | Discharge status: Home / Self Care | Payer: BC Managed Care – PPO | Source: Ambulatory Visit | Attending: Surgery | Admitting: Surgery

## 2021-01-10 DIAGNOSIS — Z20822 Contact with and (suspected) exposure to covid-19: Secondary | ICD-10-CM | POA: Insufficient documentation

## 2021-01-10 DIAGNOSIS — Z01812 Encounter for preprocedural laboratory examination: Secondary | ICD-10-CM | POA: Diagnosis not present

## 2021-01-10 LAB — SARS CORONAVIRUS 2 (TAT 6-24 HRS): SARS Coronavirus 2: NEGATIVE

## 2021-02-23 ENCOUNTER — Encounter (HOSPITAL_BASED_OUTPATIENT_CLINIC_OR_DEPARTMENT_OTHER): Payer: Self-pay | Admitting: Surgery

## 2021-02-24 ENCOUNTER — Other Ambulatory Visit: Payer: Self-pay

## 2021-02-24 ENCOUNTER — Ambulatory Visit: Payer: Self-pay | Admitting: Surgery

## 2021-02-24 ENCOUNTER — Other Ambulatory Visit (HOSPITAL_COMMUNITY): Payer: Medicaid Other

## 2021-02-24 ENCOUNTER — Encounter (HOSPITAL_BASED_OUTPATIENT_CLINIC_OR_DEPARTMENT_OTHER): Payer: Self-pay | Admitting: Surgery

## 2021-02-24 NOTE — Progress Notes (Signed)
Spoke w/ via phone for pre-op interview---pt Lab needs dos----    Urine poct           Lab results------none COVID test ------02-25-2021 255 pm Arrive at -------1000 am 02-27-2021 NPO after MN NO Solid Food.  Clear liquids from MN until---900 am then npo Med rec completed Medications to take morning of surgery -----none Diabetic medication -----n/a Patient instructed to bring photo id and insurance card day of surgery Patient aware to have Driver (ride ) / caregiver brother damian obgaugo    for 24 hours after surgery  Patient Special Instructions -----none Pre-Op special Istructions -----none Patient verbalized understanding of instructions that were given at this phone interview. Patient denies shortness of breath, chest pain, fever, cough at this phone interview.

## 2021-02-24 NOTE — H&P (Signed)
CC: Referred by dermatology for hidradenitis  HPI: Pamela Arias is a very pleasant 20yoF with hx of hidradenitis - previously has had infections/abscesses appear and drain from both axilla and groins. She has had I&Ds of these in the past. She reports that all of these episodes first began when she was younger, in high school. 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 Her groins of only drained once. She reports that her left axilla has been more problematic having had multiple prior abscesses here before. She has been seen in follow dermatology and maintained on suppressive doxycycline. Despite that, she has had issues with recurring abscesses in her left axilla. She is not concerned about the disease elsewhere in her body including right axilla or groins. She is only interested in pursuing further treatment for her left axilla. She reports last him a drain was within the last couple of months. Currently, denies any significant pain or active drainage. She denies fever/chills.  She does report history of keloid scarring on her ears where she has had piercing.  PMH: Hidradenitis suppurativa  PSH: Incision and drainage procedures of hidradenitis abscesses  FHx: Denies FHx of colorectal, breast, endometrial, ovarian or cervical cancer  Social: Denies use of tobacco. Social EtOH use. Occasional marijuana use. She works as a Programme researcher, broadcasting/film/video at [employer]: A comprehensive 10 system review of systems was completed with the patient and pertinent findings as noted above.  The patient is a 20 year old female.   Allergies Mammie Lorenzo, LPN; 81/27/5170 0:17 PM) No Known Drug Allergies  [11/27/2020]: Allergies Reconciled   Medication History Mammie Lorenzo, LPN; 49/44/9675 9:16 PM) No Current Medications Medications Reconciled  Vitals Claiborne Billings Dockery LPN; 38/46/6599 3:57 PM) 11/27/2020 2:47 PM Weight: 151.8 lb  Height: 66.5in Body Surface Area: 1.79 m Body Mass Index: 24.13 kg/m  Temp.: 98.27F (Infrared)  BP: 116/72(Sitting, Left Arm, Standard)       Physical Exam Harrell Gave M. Izeyah Deike MD; 11/27/2020 3:09 PM) The physical exam findings are as follows: Note: Constitutional: No acute distress; conversant; no deformities; wearing mask Eyes: Moist conjunctiva; no lid lag; anicteric sclerae; pupils equal and round Neck: Trachea midline; no palpable thyromegaly Lungs: Normal respiratory effort; no tactile fremitus CV: rrr; no palpable thrill; no pitting edema GI: Abdomen soft, nontender, nondistended; no palpable hepatosplenomegaly Skin: Both axilla with chronic scarring changes consistent with hidradenitis. Neither axilla without any palpable fluctuance or fullness. Significantly more scarring/induration in the left axilla. The point of her concern is more in the posterior axilla. There are no skin changes or erythema. No significant active drainage. MSK: Normal gait; no clubbing/cyanosis Psychiatric: Appropriate affect; alert and oriented 3 Lymphatic: No palpable cervical or axillary lymphadenopathy **A chaperone, [employer], was present for this encounter    Assessment & Plan Harrell Gave M. Kino Dunsworth MD; 11/27/2020 3:12 PM) HIDRADENITIS SUPPURATIVA (L73.2) Story: Pamela Arias is a very pleasant 20yoF with hidradenitis. He-most concerned about her left axilla and the ongoing issues she has had drainage/abscesses Impression: -The anatomy and physiology of the axilla discussed with the patient. The pathophysiology of hidradenitis was discussed at length as well -She has previously been on suppressive antibiotics with still having breakthrough subcutaneous abscesses in her left axilla. She is interested in having this area of disease excised. We discussed excision of left axillary hidradenitis. -The planned procedure, material risks (including, but not limited to, pain, bleeding,  infection, scarring and particularly so in  her case given her history of keloid scar, damage to surrounding structures- blood vessels/nerves, need for additional procedures, worsening of pre-existing medical conditions, recurrence, DVT/PE, pneumonia, heart attack, stroke, death) benefits and alternatives to surgery were discussed at length. We discussed the surgery yesterday issue at this particular location but would not cure her of her hidradenitis. We also discussed techniques for wound closure versus leaving these open. She is interested in primary wound closure. We discussed a higher incidence of wound infections in these cases which may require removal of suture and wound care expectations therein. We also discussed scarring in these cases. The patient's questions were answered to her satisfaction, she voiced understanding and elected to proceed with surgery. Additionally, we discussed typical postoperative expectations and the recovery process.  This patient encounter took 23 minutes today to perform the following: take history, perform exam, review outside records, interpret imaging, counsel the patient on their diagnosis and document encounter, findings & plan in the EHR  Signed by Ileana Roup, MD (11/27/2020 3:12 PM)

## 2021-02-25 ENCOUNTER — Other Ambulatory Visit (HOSPITAL_COMMUNITY)
Admission: RE | Admit: 2021-02-25 | Discharge: 2021-02-25 | Disposition: A | Discharge status: Home / Self Care | Payer: BC Managed Care – PPO | Source: Ambulatory Visit | Attending: Surgery | Admitting: Surgery

## 2021-02-25 DIAGNOSIS — L732 Hidradenitis suppurativa: Secondary | ICD-10-CM | POA: Diagnosis not present

## 2021-02-25 DIAGNOSIS — Z8619 Personal history of other infectious and parasitic diseases: Secondary | ICD-10-CM | POA: Diagnosis not present

## 2021-02-25 DIAGNOSIS — Z20822 Contact with and (suspected) exposure to covid-19: Secondary | ICD-10-CM | POA: Diagnosis not present

## 2021-02-25 DIAGNOSIS — Z01812 Encounter for preprocedural laboratory examination: Secondary | ICD-10-CM | POA: Insufficient documentation

## 2021-02-25 DIAGNOSIS — Z8719 Personal history of other diseases of the digestive system: Secondary | ICD-10-CM | POA: Diagnosis not present

## 2021-02-25 LAB — SARS CORONAVIRUS 2 (TAT 6-24 HRS): SARS Coronavirus 2: NEGATIVE

## 2021-02-27 ENCOUNTER — Ambulatory Visit (HOSPITAL_BASED_OUTPATIENT_CLINIC_OR_DEPARTMENT_OTHER): Payer: BC Managed Care – PPO | Admitting: Certified Registered"

## 2021-02-27 ENCOUNTER — Ambulatory Visit (HOSPITAL_BASED_OUTPATIENT_CLINIC_OR_DEPARTMENT_OTHER)
Admission: RE | Admit: 2021-02-27 | Discharge: 2021-02-27 | Disposition: A | Discharge status: Home / Self Care | Payer: BC Managed Care – PPO | Attending: Surgery | Admitting: Surgery

## 2021-02-27 ENCOUNTER — Encounter (HOSPITAL_BASED_OUTPATIENT_CLINIC_OR_DEPARTMENT_OTHER)
Admission: RE | Disposition: A | Discharge status: Home / Self Care | Payer: Self-pay | Source: Home / Self Care | Attending: Surgery

## 2021-02-27 ENCOUNTER — Encounter (HOSPITAL_BASED_OUTPATIENT_CLINIC_OR_DEPARTMENT_OTHER): Payer: Self-pay | Admitting: Surgery

## 2021-02-27 DIAGNOSIS — L732 Hidradenitis suppurativa: Secondary | ICD-10-CM | POA: Insufficient documentation

## 2021-02-27 DIAGNOSIS — Z20822 Contact with and (suspected) exposure to covid-19: Secondary | ICD-10-CM | POA: Insufficient documentation

## 2021-02-27 DIAGNOSIS — Z8619 Personal history of other infectious and parasitic diseases: Secondary | ICD-10-CM | POA: Insufficient documentation

## 2021-02-27 DIAGNOSIS — Z8719 Personal history of other diseases of the digestive system: Secondary | ICD-10-CM | POA: Insufficient documentation

## 2021-02-27 HISTORY — PX: HYDRADENITIS EXCISION: SHX5243

## 2021-02-27 HISTORY — DX: Hidradenitis suppurativa: L73.2

## 2021-02-27 LAB — POCT PREGNANCY, URINE: Preg Test, Ur: NEGATIVE

## 2021-02-27 SURGERY — EXCISION, HIDRADENITIS, AXILLA
Anesthesia: General | Site: Axilla | Laterality: Left

## 2021-02-27 MED ORDER — ACETAMINOPHEN 500 MG PO TABS
ORAL_TABLET | ORAL | Status: AC
  Filled 2021-02-27: qty 2

## 2021-02-27 MED ORDER — PROPOFOL 10 MG/ML IV BOLUS
INTRAVENOUS | Status: AC
  Filled 2021-02-27: qty 20

## 2021-02-27 MED ORDER — FENTANYL CITRATE (PF) 100 MCG/2ML IJ SOLN
INTRAMUSCULAR | Status: DC | PRN
  Administered 2021-02-27: 25 ug via INTRAVENOUS
  Administered 2021-02-27: 50 ug via INTRAVENOUS
  Administered 2021-02-27: 25 ug via INTRAVENOUS

## 2021-02-27 MED ORDER — LIDOCAINE 2% (20 MG/ML) 5 ML SYRINGE
INTRAMUSCULAR | Status: AC
  Filled 2021-02-27: qty 5

## 2021-02-27 MED ORDER — BUPIVACAINE LIPOSOME 1.3 % IJ SUSP: 20.0000 mL | Freq: Once | INTRAMUSCULAR | Status: DC

## 2021-02-27 MED ORDER — ONDANSETRON HCL 4 MG/2ML IJ SOLN
INTRAMUSCULAR | Status: DC | PRN
  Administered 2021-02-27 (×2): 4 mg via INTRAVENOUS

## 2021-02-27 MED ORDER — CEFAZOLIN SODIUM-DEXTROSE 2-4 GM/100ML-% IV SOLN
INTRAVENOUS | Status: AC
  Filled 2021-02-27: qty 100

## 2021-02-27 MED ORDER — MIDAZOLAM HCL 2 MG/2ML IJ SOLN
0.5000 mg | Freq: Once | INTRAMUSCULAR | Status: DC | PRN
Start: 2021-02-27 — End: 2021-02-27

## 2021-02-27 MED ORDER — HYDROMORPHONE HCL 1 MG/ML IJ SOLN: 0.2500 mg | INTRAMUSCULAR | Status: DC | PRN

## 2021-02-27 MED ORDER — CHLORHEXIDINE GLUCONATE CLOTH 2 % EX PADS: 6.0000 | MEDICATED_PAD | Freq: Once | CUTANEOUS | Status: DC

## 2021-02-27 MED ORDER — ONDANSETRON HCL 4 MG/2ML IJ SOLN
INTRAMUSCULAR | Status: AC
  Filled 2021-02-27: qty 2

## 2021-02-27 MED ORDER — BUPIVACAINE LIPOSOME 1.3 % IJ SUSP
INTRAMUSCULAR | Status: DC | PRN
  Administered 2021-02-27: 20 mL

## 2021-02-27 MED ORDER — BUPIVACAINE-EPINEPHRINE 0.25% -1:200000 IJ SOLN
INTRAMUSCULAR | Status: DC | PRN
  Administered 2021-02-27: 30 mL

## 2021-02-27 MED ORDER — BUPIVACAINE LIPOSOME 1.3 % IJ SUSP
INTRAMUSCULAR | Status: AC
  Filled 2021-02-27: qty 20

## 2021-02-27 MED ORDER — SCOPOLAMINE 1 MG/3DAYS TD PT72
MEDICATED_PATCH | TRANSDERMAL | Status: AC
  Filled 2021-02-27: qty 1

## 2021-02-27 MED ORDER — SCOPOLAMINE 1 MG/3DAYS TD PT72
1.0000 | MEDICATED_PATCH | TRANSDERMAL | Status: DC
  Administered 2021-02-27: 1.5 mg via TRANSDERMAL

## 2021-02-27 MED ORDER — LACTATED RINGERS IV SOLN: INTRAVENOUS | Status: DC

## 2021-02-27 MED ORDER — FENTANYL CITRATE (PF) 100 MCG/2ML IJ SOLN
INTRAMUSCULAR | Status: AC
  Filled 2021-02-27: qty 2

## 2021-02-27 MED ORDER — EPHEDRINE 5 MG/ML INJ
INTRAVENOUS | Status: AC
  Filled 2021-02-27: qty 10

## 2021-02-27 MED ORDER — PROMETHAZINE HCL 25 MG/ML IJ SOLN: 6.2500 mg | INTRAMUSCULAR | Status: DC | PRN

## 2021-02-27 MED ORDER — OXYCODONE HCL 5 MG PO TABS: 5.0000 mg | ORAL_TABLET | Freq: Once | ORAL | Status: DC | PRN

## 2021-02-27 MED ORDER — OXYCODONE HCL 5 MG/5ML PO SOLN
5.0000 mg | Freq: Once | ORAL | Status: DC | PRN
Start: 2021-02-27 — End: 2021-02-27

## 2021-02-27 MED ORDER — TRAMADOL HCL 50 MG PO TABS: 50.0000 mg | ORAL_TABLET | Freq: Four times a day (QID) | ORAL | 0 refills | Status: AC | PRN

## 2021-02-27 MED ORDER — KETOROLAC TROMETHAMINE 30 MG/ML IJ SOLN
INTRAMUSCULAR | Status: DC | PRN
  Administered 2021-02-27: 30 mg via INTRAVENOUS

## 2021-02-27 MED ORDER — DEXAMETHASONE SODIUM PHOSPHATE 10 MG/ML IJ SOLN
INTRAMUSCULAR | Status: DC | PRN
  Administered 2021-02-27: 5 mg via INTRAVENOUS

## 2021-02-27 MED ORDER — PROPOFOL 10 MG/ML IV BOLUS
INTRAVENOUS | Status: DC | PRN
  Administered 2021-02-27: 200 mg via INTRAVENOUS

## 2021-02-27 MED ORDER — CEFAZOLIN SODIUM-DEXTROSE 2-4 GM/100ML-% IV SOLN
2.0000 g | INTRAVENOUS | Status: AC
  Administered 2021-02-27: 2 g via INTRAVENOUS

## 2021-02-27 MED ORDER — MIDAZOLAM HCL 2 MG/2ML IJ SOLN
INTRAMUSCULAR | Status: AC
  Filled 2021-02-27: qty 2

## 2021-02-27 MED ORDER — ACETAMINOPHEN 500 MG PO TABS
1000.0000 mg | ORAL_TABLET | ORAL | Status: AC
  Administered 2021-02-27: 1000 mg via ORAL

## 2021-02-27 MED ORDER — LIDOCAINE 2% (20 MG/ML) 5 ML SYRINGE
INTRAMUSCULAR | Status: DC | PRN
  Administered 2021-02-27: 40 mg via INTRAVENOUS

## 2021-02-27 MED ORDER — MEPERIDINE HCL 25 MG/ML IJ SOLN: 6.2500 mg | INTRAMUSCULAR | Status: DC | PRN

## 2021-02-27 MED ORDER — MIDAZOLAM HCL 5 MG/5ML IJ SOLN
INTRAMUSCULAR | Status: DC | PRN
  Administered 2021-02-27: 2 mg via INTRAVENOUS

## 2021-02-27 MED ORDER — KETOROLAC TROMETHAMINE 30 MG/ML IJ SOLN
INTRAMUSCULAR | Status: AC
  Filled 2021-02-27: qty 1

## 2021-02-27 SURGICAL SUPPLY — 50 items
ADH SKN CLS APL DERMABOND .7 (GAUZE/BANDAGES/DRESSINGS) ×1
BLADE CLIPPER SENSICLIP SURGIC (BLADE) IMPLANT
BLADE HEX COATED 2.75 (ELECTRODE) ×2 IMPLANT
BLADE SURG 15 STRL LF DISP TIS (BLADE) ×1 IMPLANT
BLADE SURG 15 STRL SS (BLADE) ×2
BNDG GAUZE ELAST 4 BULKY (GAUZE/BANDAGES/DRESSINGS) IMPLANT
CANISTER SUCT 3000ML PPV (MISCELLANEOUS) ×2 IMPLANT
COVER BACK TABLE 60X90IN (DRAPES) IMPLANT
COVER MAYO STAND STRL (DRAPES) ×2 IMPLANT
COVER WAND RF STERILE (DRAPES) ×2 IMPLANT
DECANTER SPIKE VIAL GLASS SM (MISCELLANEOUS) ×2 IMPLANT
DERMABOND ADVANCED (GAUZE/BANDAGES/DRESSINGS) ×1
DERMABOND ADVANCED .7 DNX12 (GAUZE/BANDAGES/DRESSINGS) ×1 IMPLANT
DRAPE LAPAROTOMY 100X72 PEDS (DRAPES) IMPLANT
DRAPE UTILITY XL STRL (DRAPES) ×2 IMPLANT
DRSG PAD ABDOMINAL 8X10 ST (GAUZE/BANDAGES/DRESSINGS) IMPLANT
ELECT REM PT RETURN 9FT ADLT (ELECTROSURGICAL) ×2
ELECTRODE REM PT RTRN 9FT ADLT (ELECTROSURGICAL) ×1 IMPLANT
GAUZE PACKING IODOFORM 1/2 (PACKING) IMPLANT
GAUZE SPONGE 4X4 12PLY STRL (GAUZE/BANDAGES/DRESSINGS) IMPLANT
GLOVE SURG ENC MOIS LTX SZ7.5 (GLOVE) ×2 IMPLANT
GLOVE SURG UNDER LTX SZ8 (GLOVE) ×2 IMPLANT
GLOVE SURG UNDER POLY LF SZ7 (GLOVE) ×2 IMPLANT
GLOVE SURG UNDER POLY LF SZ7.5 (GLOVE) ×4 IMPLANT
GOWN STRL REUS W/TWL LRG LVL3 (GOWN DISPOSABLE) ×4 IMPLANT
KIT SIGMOIDOSCOPE (SET/KITS/TRAYS/PACK) IMPLANT
KIT TURNOVER CYSTO (KITS) ×2 IMPLANT
MANIFOLD NEPTUNE II (INSTRUMENTS) ×2 IMPLANT
NEEDLE HYPO 22GX1.5 SAFETY (NEEDLE) ×2 IMPLANT
NS IRRIG 1000ML POUR BTL (IV SOLUTION) IMPLANT
NS IRRIG 500ML POUR BTL (IV SOLUTION) ×4 IMPLANT
PACK BASIN DAY SURGERY FS (CUSTOM PROCEDURE TRAY) ×2 IMPLANT
PAD ARMBOARD 7.5X6 YLW CONV (MISCELLANEOUS) ×2 IMPLANT
PENCIL SMOKE EVACUATOR (MISCELLANEOUS) ×2 IMPLANT
SPONGE LAP 18X18 RF (DISPOSABLE) ×2 IMPLANT
SPONGE LAP 4X18 RFD (DISPOSABLE) IMPLANT
SUT ETHILON 3 0 FSL (SUTURE) IMPLANT
SUT MNCRL AB 3-0 PS2 18 (SUTURE) ×2 IMPLANT
SUT MNCRL AB 3-0 PS2 27 (SUTURE) ×2 IMPLANT
SUT MNCRL AB 4-0 PS2 18 (SUTURE) IMPLANT
SUT VIC AB 2-0 SH 18 (SUTURE) ×4 IMPLANT
SWAB COLLECTION DEVICE MRSA (MISCELLANEOUS) IMPLANT
SWAB CULTURE ESWAB REG 1ML (MISCELLANEOUS) IMPLANT
SYR BULB EAR ULCER 3OZ GRN STR (SYRINGE) ×2 IMPLANT
SYR CONTROL 10ML LL (SYRINGE) IMPLANT
TOWEL OR 17X26 10 PK STRL BLUE (TOWEL DISPOSABLE) ×4 IMPLANT
TRAY DSU PREP LF (CUSTOM PROCEDURE TRAY) ×2 IMPLANT
TUBE CONNECTING 12X1/4 (SUCTIONS) ×2 IMPLANT
UNDERPAD 30X36 HEAVY ABSORB (UNDERPADS AND DIAPERS) ×2 IMPLANT
YANKAUER SUCT BULB TIP NO VENT (SUCTIONS) ×2 IMPLANT

## 2021-02-27 NOTE — Anesthesia Preprocedure Evaluation (Addendum)
Anesthesia Evaluation  Patient identified by MRN, date of birth, ID band Patient awake    Reviewed: Allergy & Precautions, NPO status , Patient's Chart, lab work & pertinent test results  History of Anesthesia Complications Negative for: history of anesthetic complications  Airway Mallampati: I  TM Distance: >3 FB Neck ROM: Full    Dental  (+) Dental Advisory Given   Pulmonary Current Smoker and Patient abstained from smoking.,  02/25/2021 SARS coronavirus NEG   breath sounds clear to auscultation       Cardiovascular negative cardio ROS   Rhythm:Regular Rate:Normal     Neuro/Psych negative neurological ROS     GI/Hepatic negative GI ROS, (+)     substance abuse  marijuana use,   Endo/Other  negative endocrine ROS  Renal/GU negative Renal ROS     Musculoskeletal   Abdominal   Peds  Hematology negative hematology ROS (+)   Anesthesia Other Findings   Reproductive/Obstetrics                            Anesthesia Physical Anesthesia Plan  ASA: II  Anesthesia Plan: General   Post-op Pain Management:    Induction: Intravenous  PONV Risk Score and Plan: 3 and Ondansetron, Dexamethasone and Scopolamine patch - Pre-op  Airway Management Planned: LMA  Additional Equipment: None  Intra-op Plan:   Post-operative Plan:   Informed Consent: I have reviewed the patients History and Physical, chart, labs and discussed the procedure including the risks, benefits and alternatives for the proposed anesthesia with the patient or authorized representative who has indicated his/her understanding and acceptance.     Dental advisory given  Plan Discussed with: CRNA and Surgeon  Anesthesia Plan Comments:        Anesthesia Quick Evaluation

## 2021-02-27 NOTE — Anesthesia Procedure Notes (Signed)
Procedure Name: LMA Insertion Date/Time: 02/27/2021 10:30 AM Performed by: Marny Lowenstein, CRNA Pre-anesthesia Checklist: Patient identified, Emergency Drugs available, Suction available and Patient being monitored Patient Re-evaluated:Patient Re-evaluated prior to induction Oxygen Delivery Method: Circle system utilized Preoxygenation: Pre-oxygenation with 100% oxygen Induction Type: IV induction Ventilation: Mask ventilation without difficulty LMA: LMA inserted LMA Size: 4.0 Number of attempts: 1 Placement Confirmation: positive ETCO2 and breath sounds checked- equal and bilateral Tube secured with: Tape Dental Injury: Teeth and Oropharynx as per pre-operative assessment

## 2021-02-27 NOTE — Transfer of Care (Signed)
Immediate Anesthesia Transfer of Care Note  Patient: Pamela Arias  Procedure(s) Performed: EXCISION HIDRADENITIS LEFT AXILLA (Left Axilla)  Patient Location: PACU  Anesthesia Type:General  Level of Consciousness: drowsy and patient cooperative  Airway & Oxygen Therapy: Patient Spontanous Breathing  Post-op Assessment: Report given to RN and Post -op Vital signs reviewed and stable  Post vital signs: Reviewed and stable  Last Vitals:  Vitals Value Taken Time  BP 131/82 02/27/21 1134  Temp 36.3 C 02/27/21 1135  Pulse 86 02/27/21 1138  Resp 19 02/27/21 1138  SpO2 100 % 02/27/21 1138  Vitals shown include unvalidated device data.  Last Pain:  Vitals:   02/27/21 0854  TempSrc: Oral  PainSc: 0-No pain      Patients Stated Pain Goal: 5 (02/27/21 0854)  Complications: No complications documented.

## 2021-02-27 NOTE — Op Note (Signed)
02/27/2021  11:37 AM  PATIENT:  Pamela Arias  20 y.o. female  Patient Care Team: Patient, No Pcp Per as PCP - General (General Practice)  PRE-OPERATIVE DIAGNOSIS:  Hidradenitis - left axilla  POST-OPERATIVE DIAGNOSIS:  Same  PROCEDURE:  Excision of hidradenitis in left axilla x 3  SURGEON:  Stephanie Coup. Juanmanuel Marohl, MD  ANESTHESIA:   local and general  COUNTS:  Sponge, needle and instrument counts were reported correct x2 at the conclusion of the operation.  EBL: 5 mL  DRAINS: None  SPECIMEN: Hidradenitis, left axilla  COMPLICATIONS: None  FINDINGS: 3 separate areas of disease in the left axilla with significant scarring and subcutaneous fistula consistent with chronic hidradenitis.  Some of this disease was found to contain impacted tufts of hair.  So as to limit the excision to just abnormal tissue, 3 separate areas of excision were carried out to minimize scarring and leave as much normal skin as possible  Wound measurements #1: 7 x 3 cm #2: 4 x 2 cm #3: 3 x 1 cm   DISPOSITION: PACU in satisfactory condition  DESCRIPTION: The patient was identified in preop holding and taken to the OR where she was placed on the operating room table. SCDs were placed. General endotracheal anesthesia was induced without difficulty. The left arm was gently abducted. Pressure points were then padded. Her left axilla was then prepped and draped in the usual sterile fashion as the hair had previously been clipped. A surgical timeout was performed indicating the correct patient, procedure, positioning and need for preoperative antibiotics.   The area of disease was identified.  She has 3 separate areas in the left axilla that contained significant scarring consistent clinically with hidradenitis.  The areas of disease were marked such that elliptical excisions can be carried out.  This is done so as to minimize the amount of excision of normal skin and only remove the areas of disease.  Local  anesthetic consisting of 0.25% Marcaine with epinephrine and Exparel was infiltrated around the planned areas of excision.  Beginning with the most significant area of disease, an elliptical incision was created and this was done longitudinally as it was a longitudinal fistula.  The subcutaneous tissue was incised with electrocautery.  The diseased segment of tissue was then fully excised with electrocautery and passed off the specimen.  The wound is then irrigated and hemostasis verified.  The wound measured 7 x 3 cm.  Deep dermal tissue was approximated using 2-0 Vicryl suture.  Attention was then directed to the more inferior portion of disease and an elliptical incision was created in this extending anterior to posteriorly.  This also minimize the amount of normal skin removed.  The subcutaneous tissue and diseased segment was removed using electrocautery and passed off the specimen. The wound is then irrigated and hemostasis verified.  The wound measured 4 x 2 cm.  Deep dermal tissue was approximated using 2-0 Vicryl suture.  There was another small area of disease in the medialmost portion of the left axilla.  A relatively small apical incision was then created anterior to posteriorly to excise this small segment of disease.  The subcutaneous tissue was then dissected using electrocautery.  This was passed off the specimen. The wound is then irrigated and hemostasis verified.  The wound measured 3 x 1 cm.  Deep dermal tissue was approximated using 2-0 Vicryl suture.  All tissue had been brought together without any significant tension with her arm in the abducted position.  There was no significant pus or purulent-like fluid in any of the excision sites and all consistent with prior scarring.  The skin of all incision sites as per our discussions with her was then closed using 3-0 Monocryl subcuticular sutures.  All sponge, needle, and instrument counts have been reported correct x2.  The wounds were  then rinsed.  They were then covered in Dermabond.  Her arm was then returned to the normal anatomic position and she was awakened from anesthesia, extubated, and transferred to a stretcher for transport to PACU in satisfactory condition.

## 2021-02-27 NOTE — H&P (Signed)
CC: Here today for surgery -Left axillary hidradenitis excision  HPI: Pamela Arias is a very pleasant 19yoF with hx of hidradenitis - previously has had infections/abscesses appear and drain from both axilla and groins. She has had I&Ds of these in the past. She reports that all of these episodes first began when she was younger, in high school. 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 Her groins of only drained once. She reports that her left axilla has been more problematic having had multiple prior abscesses here before. She has been seen in follow dermatology and maintained on suppressive doxycycline. Despite that, she has had issues with recurring abscesses in her left axilla. She is not concerned about the disease elsewhere in her body including right axilla or groins. She is only interested in pursuing further treatment for her left axilla. She reports last him a drain was within the last couple of months. Currently, denies any significant pain or active drainage. She denies fever/chills.  She does report history of keloid scarring on her ears where she has had piercing.  She denies any changes in her health or health history since we met. States she understands what we are doing and is ready for surgery.  PMH: Hidradenitis suppurativa  PSH: Incision and drainage procedures of hidradenitis abscesses  FHx: Denies FHx of colorectal, breast, endometrial, ovarian or cervical cancer  Social: Denies use of tobacco. Social EtOH use. Occasional marijuana use. She works as a Programme researcher, broadcasting/film/video at [employer]: A comprehensive 10 system review of systems was completed with the patient and pertinent findings as noted above.  Past Medical History:  Diagnosis Date  . Bacterial vaginosis   . Hydradenitis    left arm    Past Surgical History:  Procedure Laterality Date  . ACHILLES TENDON SURGERY Left 08/31/2018    IRRIGATION AND DEBRIDEMENT LEFT LOWER POSTERIOR  LEG (Left)  . ACHILLES TENDON SURGERY Left 08/31/2018   Procedure: ACHILLES TENDON REPAIR LEFT;  Surgeon: Mcarthur Rossetti, MD;  Location: [place];  Service: Orthopedics;  Laterality: Left;  . hemorrhoid removed  2020  . I & D EXTREMITY Left 08/31/2018   Procedure: IRRIGATION AND DEBRIDEMENT LEFT LOWER POSTERIOR  LEG;  Surgeon: Mcarthur Rossetti, MD;  Location: [place];  Service: Orthopedics;  Laterality: Left;  . WOUND EXPLORATION Left 08/31/2018   Procedure: WOUND EXPLORATION LEFT LOWER POSTERIOR  LEG;  Surgeon: Mcarthur Rossetti, MD;  Location: [place];  Service: Orthopedics;  Laterality: Left;    Family History  Problem Relation Age of Onset  . Rheum arthritis Mother     Social:  reports that she has never smoked. She has never used smokeless tobacco. She reports current alcohol use. She reports current drug use. Drug: Marijuana.  Allergies: No Known Allergies  Medications: I have reviewed the patient's current medications.  Results for orders placed or performed during the hospital encounter of 02/27/21 (from the past 48 hour(s))  Pregnancy, urine POC     Status: None   Collection Time: 02/27/21  8:39 AM  Result Value Ref Range   Preg Test, Ur NEGATIVE NEGATIVE    Comment:        THE SENSITIVITY OF THIS METHODOLOGY IS >24 mIU/mL     No results found.  ROS - all of the below systems have been reviewed with the patient and positives are indicated with bold text General: chills, fever or night  sweats Eyes: blurry vision or double vision ENT: epistaxis or sore throat Allergy/Immunology: itchy/watery eyes or nasal congestion Hematologic/Lymphatic: bleeding problems, blood clots or swollen lymph nodes Endocrine: temperature intolerance or unexpected weight changes Breast: new or changing breast lumps or nipple discharge Resp: cough, shortness of breath, or wheezing CV: chest pain or dyspnea on exertion GI: as per  HPI GU: dysuria, trouble voiding, or hematuria MSK: joint pain or joint stiffness Neuro: TIA or stroke symptoms Derm: pruritus and skin lesion changes Psych: anxiety and depression  PE Blood pressure 114/75, pulse 71, temperature 98.6 F (37 C), temperature source Oral, resp. rate 18, height 5' 6.5" (1.689 m), weight 69.5 kg, last menstrual period 02/21/2021, SpO2 100 %. Constitutional: NAD; conversant; wearing mask Eyes: Moist conjunctiva; no lid lag; anicteric Lungs: Normal respiratory effort CV: RRR; no pitting edema MSK: Normal range of motion of extremities Skin: Left axilla with scarring consistent with what we had seen in office - hidradenitis. Site marked for surgery Psychiatric: Appropriate affect; alert and oriented x3  Results for orders placed or performed during the hospital encounter of 02/27/21 (from the past 48 hour(s))  Pregnancy, urine POC     Status: None   Collection Time: 02/27/21  8:39 AM  Result Value Ref Range   Preg Test, Ur NEGATIVE NEGATIVE    Comment:        THE SENSITIVITY OF THIS METHODOLOGY IS >24 mIU/mL     No results found.   A/P: Ms. Cashion is a very pleasant 19yoF with hidradenitis. He-most concerned about her left axilla and the ongoing issues she has had drainage/abscesses  -The anatomy and physiology of the axilla discussed with the patient. The pathophysiology of hidradenitis was discussed at length as well  -She has previously been on suppressive antibiotics with still having breakthrough subcutaneous abscesses in her left axilla. She is interested in having this area of disease excised. We discussed excision of left axillary hidradenitis.  -The planned procedure, material risks (including, but not limited to, pain, bleeding, infection, scarring and particularly so in her case given her history of keloid scar, damage to surrounding structures- blood vessels/nerves, need for additional procedures, worsening of pre-existing medical  conditions, recurrence, DVT/PE, pneumonia, heart attack, stroke, death) benefits and alternatives to surgery were discussed at length. We discussed the surgery yesterday issue at this particular location but would not cure her of her hidradenitis. We also discussed techniques for wound closure versus leaving these open. She is interested in primary wound closure. We discussed a higher incidence of wound infections in these cases which may require removal of suture and wound care expectations therein including open wound for upwards of 2-3 months while things heal. We also discussed scarring in these cases. The patient's questions were answered to her satisfaction, she voiced understanding and elected to proceed with surgery. Additionally, we discussed typical postoperative expectations and the recovery process.  Pamela Landau, MD [facility] Use [email or web address] to contact on call provider

## 2021-02-27 NOTE — Discharge Instructions (Addendum)
POST OP INSTRUCTIONS  1. DIET: As tolerated. Follow a light bland diet the first 24 hours after arrival home, such as soup, liquids, crackers, etc.  Be sure to include lots of fluids daily.  Avoid fast food or heavy meals as your are more likely to get nauseated.  Eat a low fat the next few days after surgery.  2. Take your usually prescribed home medications unless otherwise directed.  3. PAIN CONTROL: a. Pain is best controlled by a usual combination of three different methods TOGETHER: i. Ice/Heat ii. Over the counter pain medication iii. Prescription pain medication b. Most patients will experience some swelling and bruising around the surgical site.  Ice packs or heating pads (30-60 minutes up to 6 times a day) will help. Some people prefer to use ice alone, heat alone, alternating between ice & heat.  Experiment to what works for you.  Swelling and bruising can take several weeks to resolve.   c. It is helpful to take an over-the-counter pain medication regularly for the first few weeks: i. Ibuprofen (Motrin/Advil) - 200mg  tabs - take 3 tabs (600mg ) every 6 hours as needed for pain ii. Acetaminophen (Tylenol) - you may take 650mg  every 6 hours as needed. You can take this with motrin as they act differently on the body. If you are taking a narcotic pain medication that has acetaminophen in it, do not take over the counter tylenol at the same time.  Iii. NOTE: You may take both of these medications together - most patients  find it most helpful when alternating between the two (i.e. Ibuprofen at 6am, tylenol at 9am, ibuprofen at 12pm ...) d. A  prescription for pain medication should be given to you upon discharge.  Take your pain medication as prescribed if your pain is not adequatly controlled with the over-the-counter pain reliefs mentioned above.  4. Avoid getting constipated.  Between the surgery and the pain medications, it is common to experience some constipation.  Increasing fluid  intake and taking a fiber supplement (such as Metamucil, Citrucel, FiberCon, MiraLax, etc) 1-2 times a day regularly will usually help prevent this problem from occurring.  A mild laxative (prune juice, Milk of Magnesia, MiraLax, etc) should be taken according to package directions if there are no bowel movements after 48 hours.    5. Dressing: Your incisions are covered in Dermabond which is like sterile superglue for the skin. This will come off on it's own in a couple weeks. It is waterproof and you may bathe normally starting the day after your surgery in a shower. Avoid baths/pools/lakes/oceans until your wounds have fully healed.  6. ACTIVITIES as tolerated:   a. You may resume regular (light) daily activities beginning the next day--such as daily self-care, walking, climbing stairs--gradually increasing activities as tolerated.  If you can walk 30 minutes without difficulty, it is safe to try more intense activity such as jogging, treadmill, bicycling, low-impact aerobics.  b. DO NOT PUSH THROUGH PAIN.  Let pain be your guide. Starting on Sunday, begin doing gentle exercises with your arm to maintain range of motion - these are not with weights but by simply beginning to raise your arm. Much like stretching, over the course of the next couple weeks you should gain more and more ground until normal range of motion is achieved. This is important as it will help prevent your from developing too much scar tissue that restricts range of motion. c. You may drive when you are no longer taking  prescription pain medication, you can comfortably wear a seatbelt, and you can safely maneuver your car and apply brakes.  7. FOLLOW UP in our office a. Please call [facility] at [phone] to set up an appointment to see your surgeon in the office for a follow-up appointment approximately 2 weeks after your surgery. b. Make sure that you call for this appointment the day you arrive home to insure a convenient  appointment time.  10. If you have disability or family leave forms that need to be completed, you may have them completed by your primary care physician's office; for return to work instructions, please ask our office staff and they will be happy to assist you in obtaining this documentation   When to call us [phone]: 1. Poor pain control 2. Reactions / problems with new medications (rash/itching, etc)  3. Fever over 101.5 F (38.5 C) 4. Inability to urinate 5. Nausea/vomiting 6. Worsening swelling or bruising 7. Continued bleeding from incision. 8. Increased pain, redness, or drainage from the incision  The clinic staff is available to answer your questions during regular business hours (8:30am-5pm).  Please don't hesitate to call and ask to speak to one of our nurses for clinical concerns.   A surgeon from [facility] is always on call at the hospitals   If you have a medical emergency, go to the nearest emergency room or call 911.  [facility] [address], [place], [place]  [address] MAIN: [phone] FAX: [phone] [email or web address]     Post Anesthesia Home Care Instructions  Activity: Get plenty of rest for the remainder of the day. A responsible individual must stay with you for 24 hours following the procedure.  For the next 24 hours, DO NOT: -Drive a car -Advertising copywriter -Drink alcoholic beverages -Take any medication unless instructed by your physician -Make any legal decisions or sign important papers.  Meals: Start with liquid foods such as gelatin or soup. Progress to regular foods as tolerated. Avoid greasy, spicy, heavy foods. If nausea and/or vomiting occur, drink only clear liquids until the nausea and/or vomiting subsides. Call your physician if vomiting continues.  Special Instructions/Symptoms: Your throat may feel dry or sore from the anesthesia or the breathing tube placed in  your throat during surgery. If this causes discomfort, gargle with warm salt water. The discomfort should disappear within 24 hours.  If you had a scopolamine patch placed behind your ear for the management of post- operative nausea and/or vomiting:  1. The medication in the patch is effective for 72 hours, after which it should be removed.  Wrap patch in a tissue and discard in the trash. Wash hands thoroughly with soap and water. 2. You may remove the patch earlier than 72 hours if you experience unpleasant side effects which may include dry mouth, dizziness or visual disturbances. 3. Avoid touching the patch. Wash your hands with soap and water after contact with the patch.    Information for Discharge Teaching: EXPAREL (bupivacaine liposome injectable suspension)   Your surgeon or anesthesiologist gave you EXPAREL(bupivacaine) to help control your pain after surgery.   EXPAREL is a local anesthetic that provides pain relief by numbing the tissue around the surgical site.  EXPAREL is designed to release pain medication over time and can control pain for up to 72 hours.  Depending on how you respond to EXPAREL, you may require less pain medication during your recovery.  Possible side effects:  Temporary loss of sensation or ability to move in the area where bupivacaine was injected.  Nausea, vomiting, constipation  Rarely, numbness and tingling in your mouth or lips, lightheadedness, or anxiety may occur.  Call your doctor right away if you think you may be experiencing any of these sensations, or if you have other questions regarding possible side effects.  Follow all other discharge instructions given to you by your surgeon or nurse. Eat a healthy diet and drink plenty of water or other fluids.  If you return to the hospital for any reason within 96 hours following the administration of EXPAREL, it is important for health care providers to know that you have received this  anesthetic. A teal colored band has been placed on your arm with the date, time and amount of EXPAREL you have received in order to alert and inform your health care providers. Please leave this armband in place for the full 96 hours following administration, and then you may remove the band.

## 2021-02-27 NOTE — Anesthesia Postprocedure Evaluation (Signed)
Anesthesia Post Note  Patient: Pamela Arias  Procedure(s) Performed: EXCISION HIDRADENITIS LEFT AXILLA (Left Axilla)     Patient location during evaluation: Phase II Anesthesia Type: General Level of consciousness: awake and alert, patient cooperative and oriented Pain management: pain level controlled Vital Signs Assessment: post-procedure vital signs reviewed and stable Respiratory status: spontaneous breathing, nonlabored ventilation and respiratory function stable Cardiovascular status: blood pressure returned to baseline and stable Postop Assessment: no apparent nausea or vomiting, able to ambulate and adequate PO intake Anesthetic complications: no   No complications documented.  Last Vitals:  Vitals:   02/27/21 1200 02/27/21 1205  BP: 122/78 (!) 129/93  Pulse: (!) 51 (!) 51  Resp: 13 16  Temp:    SpO2: 98% 100%    Last Pain:  Vitals:   02/27/21 1200  TempSrc:   PainSc: 0-No pain                 JACKSON,E. CARSWELL

## 2021-03-02 LAB — SURGICAL PATHOLOGY

## 2021-03-03 ENCOUNTER — Encounter (HOSPITAL_BASED_OUTPATIENT_CLINIC_OR_DEPARTMENT_OTHER): Payer: Self-pay | Admitting: Surgery

## 2022-06-25 ENCOUNTER — Ambulatory Visit
Admission: EM | Admit: 2022-06-25 | Discharge: 2022-06-25 | Disposition: A | Discharge status: Home / Self Care | Payer: BC Managed Care – PPO | Attending: Emergency Medicine | Admitting: Emergency Medicine

## 2022-06-25 DIAGNOSIS — R300 Dysuria: Secondary | ICD-10-CM | POA: Diagnosis not present

## 2022-06-25 DIAGNOSIS — K5900 Constipation, unspecified: Secondary | ICD-10-CM | POA: Insufficient documentation

## 2022-06-25 DIAGNOSIS — N921 Excessive and frequent menstruation with irregular cycle: Secondary | ICD-10-CM | POA: Insufficient documentation

## 2022-06-25 DIAGNOSIS — R1030 Lower abdominal pain, unspecified: Secondary | ICD-10-CM | POA: Diagnosis not present

## 2022-06-25 LAB — POCT URINE PREGNANCY: Preg Test, Ur: NEGATIVE

## 2022-06-25 LAB — POCT URINALYSIS DIP (MANUAL ENTRY)
Glucose, UA: NEGATIVE mg/dL
Nitrite, UA: NEGATIVE
Protein Ur, POC: 100 mg/dL — AB
Spec Grav, UA: >=1.03 — AB (ref 1.010–1.025)
Urobilinogen, UA: 0.2 E.U./dL
pH, UA: 5 (ref 5.0–8.0)

## 2022-06-25 MED ORDER — POLYETHYLENE GLYCOL 3350 17 GM/SCOOP PO POWD: ORAL | 0 refills | Status: DC

## 2022-06-25 MED ORDER — POLYETHYLENE GLYCOL 3350 17 GM/SCOOP PO POWD: 17.0000 g | Freq: Every morning | ORAL | 2 refills | Status: AC

## 2022-06-25 NOTE — ED Provider Notes (Signed)
[facility]    CSN: [id number] Arrival date & time: 06/25/22  0943    HISTORY   Chief Complaint  Patient presents with   Abdominal Pain   HPI Pamela Arias is a pleasant, 21 y.o. female who presents to urgent care today complaining of Lower abdominal pain along with pain with urination.  Patient also reports heavy bleeding with her menstrual cycle which comes and goes.  Patient was seen for this exact same complaint on June 06, 2022 urgent care, was advised to see a gynecologist and provided with a 10-day prescription for Provera.  Patient states she has not scheduled appoint with her gynecologist and only took 2 days of the Provera.  The history is provided by the patient.   Past Medical History:  Diagnosis Date   Bacterial vaginosis    Hydradenitis    left arm   Patient Active Problem List   Diagnosis Date Noted   Achilles tendon injury 08/31/2018   Achilles rupture, left, initial encounter 08/31/2018   Past Surgical History:  Procedure Laterality Date   ACHILLES TENDON SURGERY Left 08/31/2018   IRRIGATION AND DEBRIDEMENT LEFT LOWER POSTERIOR  LEG (Left)   ACHILLES TENDON SURGERY Left 08/31/2018   Procedure: ACHILLES TENDON REPAIR LEFT;  Surgeon: Kathryne Hitch, MD;  Location: [facility];  Service: Orthopedics;  Laterality: Left;   hemorrhoid removed  2020   HYDRADENITIS EXCISION Left 02/27/2021   Procedure: EXCISION HIDRADENITIS LEFT AXILLA;  Surgeon: Andria Meuse, MD;  Location: [facility];  Service: General;  Laterality: Left;   I & D EXTREMITY Left 08/31/2018   Procedure: IRRIGATION AND DEBRIDEMENT LEFT LOWER POSTERIOR  LEG;  Surgeon: Kathryne Hitch, MD;  Location: [facility];  Service: Orthopedics;  Laterality: Left;   WOUND EXPLORATION Left 08/31/2018   Procedure: WOUND EXPLORATION LEFT LOWER POSTERIOR  LEG;  Surgeon: Kathryne Hitch, MD;  Location: [facility];  Service: Orthopedics;  Laterality: Left;   OB History    No obstetric history on file.    Home Medications    Prior to Admission medications   Medication Sig Start Date End Date Taking? Authorizing Provider  acetaminophen (TYLENOL) 500 MG tablet Take 1,000 mg by mouth every 6 (six) hours as needed.    [provider]  ibuprofen (ADVIL) 200 MG tablet Take 200 mg by mouth every 6 (six) hours as needed.    [provider]  medroxyPROGESTERone (PROVERA) 10 MG tablet Take 10 mg by mouth daily. 06/06/22   [provider]    Family History Family History  Problem Relation Age of Onset   Rheum arthritis Mother    Social History Social History   Tobacco Use   Smoking status: Never   Smokeless tobacco: Never  Vaping Use   Vaping Use: Never used  Substance Use Topics   Alcohol use: Yes    Comment: occ weekends   Drug use: Yes    Types: Marijuana    Comment: marijuana last used 3 wks ago as of 02-24-2021   Allergies   Patient has no known allergies.  Review of Systems Review of Systems Pertinent findings revealed after performing a 14 point review of systems has been noted in the history of present illness.  Physical Exam Triage Vital Signs ED Triage Vitals  Enc Vitals Group     BP 10/09/21 0827 (!) 147/82     Pulse Rate 10/09/21 0827 72     Resp 10/09/21 0827 18  Temp 10/09/21 0827 98.3 F (36.8 C)     Temp Source 10/09/21 0827 Oral     SpO2 10/09/21 0827 98 %     Weight --      Height --      Head Circumference --      Peak Flow --      Pain Score 10/09/21 0826 5     Pain Loc --      Pain Edu? --      Excl. in GC? --   No data found.  Updated Vital Signs BP 121/77 (BP Location: Right Arm)   Pulse 86   Temp 99.3 F (37.4 C) (Oral)   Resp 18   LMP 06/20/2022   SpO2 96%   Physical Exam Vitals and nursing note reviewed.  Constitutional:      General: She is not in acute distress.    Appearance: Normal appearance. She is not ill-appearing.  HENT:     Head: Normocephalic and  atraumatic.  Eyes:     General: Lids are normal.        Right eye: No discharge.        Left eye: No discharge.     Extraocular Movements: Extraocular movements intact.     Conjunctiva/sclera: Conjunctivae normal.     Right eye: Right conjunctiva is not injected.     Left eye: Left conjunctiva is not injected.  Neck:     Trachea: Trachea and phonation normal.  Cardiovascular:     Rate and Rhythm: Normal rate and regular rhythm.     Pulses: Normal pulses.     Heart sounds: Normal heart sounds. No murmur heard.    No friction rub. No gallop.  Pulmonary:     Effort: Pulmonary effort is normal. No accessory muscle usage, prolonged expiration or respiratory distress.     Breath sounds: Normal breath sounds. No stridor, decreased air movement or transmitted upper airway sounds. No decreased breath sounds, wheezing, rhonchi or rales.  Chest:     Chest wall: No tenderness.  Abdominal:     General: Abdomen is flat. Bowel sounds are decreased. There is no distension.     Palpations: Abdomen is soft.     Tenderness: There is abdominal tenderness in the suprapubic area. There is no right CVA tenderness or left CVA tenderness.     Hernia: No hernia is present.  Musculoskeletal:        General: Normal range of motion.     Cervical back: Normal range of motion and neck supple. Normal range of motion.  Lymphadenopathy:     Cervical: No cervical adenopathy.  Skin:    General: Skin is warm and dry.     Findings: No erythema or rash.  Neurological:     General: No focal deficit present.     Mental Status: She is alert and oriented to person, place, and time.  Psychiatric:        Mood and Affect: Mood normal.        Behavior: Behavior normal.     Visual Acuity Right Eye Distance:   Left Eye Distance:   Bilateral Distance:    Right Eye Near:   Left Eye Near:    Bilateral Near:     [facility] Couse / Diagnostics / Procedures:     Radiology No results found.  Procedures Procedures  (including critical care time) EKG  Pending results:  Labs Reviewed  POCT URINALYSIS DIP (MANUAL ENTRY) - Abnormal; Notable for the following components:  Result Value   Color, UA red (*)    Clarity, UA cloudy (*)    Bilirubin, UA small (*)    Ketones, POC UA >= (160) (*)    Spec Grav, UA >=1.030 (*)    Blood, UA large (*)    Protein Ur, POC =100 (*)    Leukocytes, UA Small (1+) (*)    All other components within normal limits  URINE CULTURE  POCT URINE PREGNANCY    Medications Ordered in UC: Medications - No data to display  UC Diagnoses / Final Clinical Impressions(s)   I have reviewed the triage vital signs and the nursing notes.  Pertinent labs & imaging results that were available during my care of the patient were reviewed by me and considered in my medical decision making (see chart for details).    Final diagnoses:  Dysuria  Constipation, unspecified constipation type  Menometrorrhagia  Lower abdominal pain   Urine pregnancy test today is negative.  Urinalysis is concerning for dehydration which may be the underlying cause of her feeling constipated at this time.  Patient advised to perform bowel cleanout with MiraLAX and Gatorade to rule this out as a possible cause of her lower abdominal pain at this time.  Patient also provided with the name of a gynecologist to follow-up with since she has not made an effort to do so as of yet.  Return precautions advised.  ED Prescriptions     Medication Sig Dispense Auth. Provider   polyethylene glycol powder (GLYCOLAX/MIRALAX) 17 GM/SCOOP powder Mix one half bottle with 32 ounces of Gatorade.  Consume entire bottle within 1 hour.  May repeat in 6 hours if needed. 510 g Theadora Rama Scales, PA-C   polyethylene glycol powder (MIRALAX) 17 GM/SCOOP powder Take 17 g by mouth in the morning. 510 g Theadora Rama Scales, PA-C      PDMP not reviewed this encounter.  Disposition Upon Discharge:  Condition: stable for  discharge home  Patient presented with concern for an acute illness with associated systemic symptoms and significant discomfort requiring urgent management. In my opinion, this is a condition that a prudent lay person (someone who possesses an average knowledge of health and medicine) may potentially expect to result in complications if not addressed urgently such as respiratory distress, impairment of bodily function or dysfunction of bodily organs.   As such, the patient has been evaluated and assessed, work-up was performed and treatment was provided in alignment with urgent care protocols and evidence based medicine.  Patient/parent/caregiver has been advised that the patient may require follow up for further testing and/or treatment if the symptoms continue in spite of treatment, as clinically indicated and appropriate.  Routine symptom specific, illness specific and/or disease specific instructions were discussed with the patient and/or caregiver at length.  Prevention strategies for avoiding STD exposure were also discussed.  The patient will follow up with their current PCP if and as advised. If the patient does not currently have a PCP we will assist them in obtaining one.   The patient may need specialty follow up if the symptoms continue, in spite of conservative treatment and management, for further workup, evaluation, consultation and treatment as clinically indicated and appropriate.  Patient/parent/caregiver verbalized understanding and agreement of plan as discussed.  All questions were addressed during visit.  Please see discharge instructions below for further details of plan.  Discharge Instructions:   Discharge Instructions      Your urine pregnancy test is negative.  Your  urinalysis is concerning for significant dehydration, I do not believe that you are drinking enough fluid every day.  You should try to drink between 80 and 120 ounces of clear liquid per day.  Please  start now.  Because you are having trouble with moving your bowels (pooping), please pick up a bottle of MiraLAX at your pharmacy and mix it in 32 ounce bottle of Gatorade.  Drink the entire bottle of Gatorade within 1 hour and in the next few hours you should begin to have the urge to poop.  If you do not have a meaningful result after this first round of Gatorade, please repeat the process after 6 hours.  When you have satisfactorily cleared your bowels, please consider taking 1 capful of MiraLAX in 8 ounces of water every morning to prevent constipation from returning.  Please reach out to Dr. Golden Pop office to schedule appointment about your irregular periods and irregular bleeding.  She can usually get people in facets at the downtown [facility] which is where I see her, her clinic is located on the second floor of the building.  Thank you for visiting urgent care today.      This office note has been dictated using Teaching laboratory technician.  Unfortunately, this method of dictation can sometimes lead to typographical or grammatical errors.  I apologize for your inconvenience in advance if this occurs.  Please do not hesitate to reach out to me if clarification is needed.       Theadora Rama Scales, PA-C 06/25/22 1233

## 2022-06-25 NOTE — Discharge Instructions (Addendum)
Your urine pregnancy test is negative.  Your urinalysis is concerning for significant dehydration, I do not believe that you are drinking enough fluid every day.  You should try to drink between 80 and 120 ounces of clear liquid per day.  Please start now.  Because you are having trouble with moving your bowels (pooping), please pick up a bottle of MiraLAX at your pharmacy and mix it in 32 ounce bottle of Gatorade.  Drink the entire bottle of Gatorade within 1 hour and in the next few hours you should begin to have the urge to poop.  If you do not have a meaningful result after this first round of Gatorade, please repeat the process after 6 hours.  When you have satisfactorily cleared your bowels, please consider taking 1 capful of MiraLAX in 8 ounces of water every morning to prevent constipation from returning.  Please reach out to Dr. Golden Pop office to schedule appointment about your irregular periods and irregular bleeding.  She can usually get people in facets at the downtown [facility] which is where I see her, her clinic is located on the second floor of the building.  Thank you for visiting urgent care today.

## 2022-06-25 NOTE — ED Triage Notes (Signed)
Pt c/o lower abd pain, and pain with urination. The patient states she has been having a heavy menstrual cycle that has been on and off. The patient was told to get a GYN appt and to schedule an ultrasound, she was placed on provera but only took two doses.

## 2022-06-27 LAB — URINE CULTURE: Culture: 5000 — AB

## 2022-09-30 ENCOUNTER — Ambulatory Visit
Admission: EM | Admit: 2022-09-30 | Discharge: 2022-09-30 | Disposition: A | Discharge status: Home / Self Care | Payer: BC Managed Care – PPO | Attending: Urgent Care | Admitting: Urgent Care

## 2022-09-30 DIAGNOSIS — Z113 Encounter for screening for infections with a predominantly sexual mode of transmission: Secondary | ICD-10-CM | POA: Insufficient documentation

## 2022-09-30 NOTE — ED Triage Notes (Signed)
Pt presents with c/o request for STD testing. No sx.

## 2022-09-30 NOTE — ED Provider Notes (Signed)
[facility]  Note:  This document was prepared using Conservation officer, historic buildings and may include unintentional dictation errors.  MRN: [id number] DOB: February 08, 2001  Subjective:   Pamela Arias is a 21 y.o. female presenting for standard STI screening with a vaginal swab only.  She is sexually active with 1 female partner, consistently uses condoms for protection but just likes to make sure she gets screened regularly.  She did have HIV and syphilis testing in July which was negative. Denies fever, n/v, abdominal pain, pelvic pain, rashes, dysuria, urinary frequency, hematuria, vaginal discharge.    No current facility-administered medications for this encounter.  Current Outpatient Medications:    acetaminophen (TYLENOL) 500 MG tablet, Take 1,000 mg by mouth every 6 (six) hours as needed., Disp: , Rfl:    ibuprofen (ADVIL) 200 MG tablet, Take 200 mg by mouth every 6 (six) hours as needed., Disp: , Rfl:    medroxyPROGESTERone (PROVERA) 10 MG tablet, Take 10 mg by mouth daily. (Patient not taking: Reported on 09/30/2022), Disp: , Rfl:    polyethylene glycol powder (GLYCOLAX/MIRALAX) 17 GM/SCOOP powder, Mix one half bottle with 32 ounces of Gatorade.  Consume entire bottle within 1 hour.  May repeat in 6 hours if needed. (Patient not taking: Reported on 09/30/2022), Disp: 510 g, Rfl: 0   No Known Allergies  Past Medical History:  Diagnosis Date   Bacterial vaginosis    Hydradenitis    left arm     Past Surgical History:  Procedure Laterality Date   ACHILLES TENDON SURGERY Left 08/31/2018   IRRIGATION AND DEBRIDEMENT LEFT LOWER POSTERIOR  LEG (Left)   ACHILLES TENDON SURGERY Left 08/31/2018   Procedure: ACHILLES TENDON REPAIR LEFT;  Surgeon: Kathryne Hitch, MD;  Location: [facility];  Service: Orthopedics;  Laterality: Left;   hemorrhoid removed  2020   HYDRADENITIS EXCISION Left 02/27/2021   Procedure: EXCISION HIDRADENITIS LEFT AXILLA;  Surgeon:  Andria Meuse, MD;  Location: [facility];  Service: General;  Laterality: Left;   I & D EXTREMITY Left 08/31/2018   Procedure: IRRIGATION AND DEBRIDEMENT LEFT LOWER POSTERIOR  LEG;  Surgeon: Kathryne Hitch, MD;  Location: [facility];  Service: Orthopedics;  Laterality: Left;   WOUND EXPLORATION Left 08/31/2018   Procedure: WOUND EXPLORATION LEFT LOWER POSTERIOR  LEG;  Surgeon: Kathryne Hitch, MD;  Location: [facility];  Service: Orthopedics;  Laterality: Left;    Family History  Problem Relation Age of Onset   Rheum arthritis Mother     Social History   Tobacco Use   Smoking status: Never   Smokeless tobacco: Never  Vaping Use   Vaping Use: Never used  Substance Use Topics   Alcohol use: Yes    Comment: occ weekends   Drug use: Yes    Types: Marijuana    Comment: marijuana last used 3 wks ago as of 02-24-2021    ROS   Objective:   Vitals: BP 133/88 (BP Location: Left Arm)   Pulse 72   Temp 98 F (36.7 C) (Oral)   Resp 14   SpO2 98%   Physical Exam Constitutional:      General: She is not in acute distress.    Appearance: Normal appearance. She is well-developed. She is not ill-appearing, toxic-appearing or diaphoretic.  HENT:     Head: Normocephalic and atraumatic.     Nose: Nose normal.     Mouth/Throat:     Mouth: Mucous membranes are moist.  Eyes:     General: No scleral icterus.       Right eye: No discharge.        Left eye: No discharge.     Extraocular Movements: Extraocular movements intact.  Cardiovascular:     Rate and Rhythm: Normal rate.  Pulmonary:     Effort: Pulmonary effort is normal.  Skin:    General: Skin is warm and dry.  Neurological:     General: No focal deficit present.     Mental Status: She is alert and oriented to person, place, and time.  Psychiatric:        Mood and Affect: Mood normal.        Behavior: Behavior normal.        Thought Content: Thought content normal.        Judgment: Judgment  normal.     Assessment and Plan :   PDMP not reviewed this encounter.  1. Screen for STD (sexually transmitted disease)     Pending lab results, treat as appropriate.  Encouraged continued safe sex practices.   Jaynee Eagles, PA-C 09/30/22 1828

## 2022-10-04 LAB — CERVICOVAGINAL ANCILLARY ONLY
Chlamydia: NEGATIVE
Comment: NEGATIVE
Comment: NEGATIVE
Comment: NORMAL
Neisseria Gonorrhea: NEGATIVE
Trichomonas: NEGATIVE

## 2023-09-28 ENCOUNTER — Ambulatory Visit
Admission: EM | Admit: 2023-09-28 | Discharge: 2023-09-28 | Disposition: A | Discharge status: Home / Self Care | Payer: BC Managed Care – PPO | Attending: Internal Medicine | Admitting: Internal Medicine

## 2023-09-28 DIAGNOSIS — L739 Follicular disorder, unspecified: Secondary | ICD-10-CM | POA: Diagnosis present

## 2023-09-28 DIAGNOSIS — Z113 Encounter for screening for infections with a predominantly sexual mode of transmission: Secondary | ICD-10-CM | POA: Diagnosis present

## 2023-09-28 DIAGNOSIS — L732 Hidradenitis suppurativa: Secondary | ICD-10-CM | POA: Diagnosis present

## 2023-09-28 NOTE — Discharge Instructions (Addendum)
We will let you know about your test results as they come back.  Most of them will result tomorrow.  The culture from the sample I obtained will probably result next week.  For now, I would not do anything to the area you are most worried about.  I suspect this is folliculitis which is inflammatory.  The area looks like it is doing very well.  Just continue with general hygiene and avoid any razors, hair clippers to the area.  Did not use anything fragrant or new to the genital area.

## 2023-09-28 NOTE — ED Triage Notes (Signed)
Pt requesting STD testing-NAD-steady gait

## 2023-09-28 NOTE — ED Provider Notes (Signed)
[facility]  Note:  This document was prepared using Conservation officer, historic buildings and may include unintentional dictation errors.  MRN: [id number] DOB: 2001-10-28  Subjective:   Pamela Arias is a 22 y.o. female presenting for standard STI testing. Had new sex partner, no condom use. Denies fever, n/v, abdominal pain, pelvic pain, rashes, dysuria, urinary frequency, hematuria, vaginal discharge.  Patient did pop a small bump in the pelvic/pubic area and wants to be evaluated for it.  She does have a history of hidradenitis suppurativa per patient.  No flareups currently.  No current facility-administered medications for this encounter.  Current Outpatient Medications:    acetaminophen (TYLENOL) 500 MG tablet, Take 1,000 mg by mouth every 6 (six) hours as needed., Disp: , Rfl:    ibuprofen (ADVIL) 200 MG tablet, Take 200 mg by mouth every 6 (six) hours as needed., Disp: , Rfl:    medroxyPROGESTERone (PROVERA) 10 MG tablet, Take 10 mg by mouth daily. (Patient not taking: Reported on 09/30/2022), Disp: , Rfl:    polyethylene glycol powder (GLYCOLAX/MIRALAX) 17 GM/SCOOP powder, Mix one half bottle with 32 ounces of Gatorade.  Consume entire bottle within 1 hour.  May repeat in 6 hours if needed. (Patient not taking: Reported on 09/30/2022), Disp: 510 g, Rfl: 0   No Known Allergies  Past Medical History:  Diagnosis Date   Bacterial vaginosis    Hydradenitis    left arm     Past Surgical History:  Procedure Laterality Date   ACHILLES TENDON SURGERY Left 08/31/2018   IRRIGATION AND DEBRIDEMENT LEFT LOWER POSTERIOR  LEG (Left)   ACHILLES TENDON SURGERY Left 08/31/2018   Procedure: ACHILLES TENDON REPAIR LEFT;  Surgeon: Kathryne Hitch, MD;  Location: [facility];  Service: Orthopedics;  Laterality: Left;   hemorrhoid removed  2020   HYDRADENITIS EXCISION Left 02/27/2021   Procedure: EXCISION HIDRADENITIS LEFT AXILLA;  Surgeon: Andria Meuse, MD;   Location: [facility];  Service: General;  Laterality: Left;   I & D EXTREMITY Left 08/31/2018   Procedure: IRRIGATION AND DEBRIDEMENT LEFT LOWER POSTERIOR  LEG;  Surgeon: Kathryne Hitch, MD;  Location: [facility];  Service: Orthopedics;  Laterality: Left;   WOUND EXPLORATION Left 08/31/2018   Procedure: WOUND EXPLORATION LEFT LOWER POSTERIOR  LEG;  Surgeon: Kathryne Hitch, MD;  Location: [facility];  Service: Orthopedics;  Laterality: Left;    Family History  Problem Relation Age of Onset   Rheum arthritis Mother     Social History   Tobacco Use   Smoking status: Never   Smokeless tobacco: Never  Vaping Use   Vaping status: Never Used  Substance Use Topics   Alcohol use: Yes    Comment: occ   Drug use: Yes    Types: Marijuana    ROS   Objective:   Vitals: BP 132/80 (BP Location: Left Arm)   Pulse 66   Temp (!) 97.3 F (36.3 C) (Oral)   Resp 16   SpO2 99%   Physical Exam Exam conducted with a chaperone present Restaurant manager, fast food).  Constitutional:      General: She is not in acute distress.    Appearance: Normal appearance. She is well-developed. She is not ill-appearing, toxic-appearing or diaphoretic.  HENT:     Head: Normocephalic and atraumatic.     Nose: Nose normal.     Mouth/Throat:     Mouth: Mucous membranes are moist.  Eyes:     General: No  scleral icterus.       Right eye: No discharge.        Left eye: No discharge.     Extraocular Movements: Extraocular movements intact.  Cardiovascular:     Rate and Rhythm: Normal rate.  Pulmonary:     Effort: Pulmonary effort is normal.  Genitourinary:      Comments: No clusters of vesicular lesions on an erythematous base. Skin:    General: Skin is warm and dry.  Neurological:     General: No focal deficit present.     Mental Status: She is alert and oriented to person, place, and time.  Psychiatric:        Mood and Affect: Mood normal.        Behavior: Behavior normal.         Thought Content: Thought content normal.        Judgment: Judgment normal.    Assessment and Plan :   PDMP not reviewed this encounter.  1. Screen for STD (sexually transmitted disease)   2. Hidradenitis suppurativa   3. Folliculitis    No signs of HSV but I did obtain a swab to reassure patient.  Anticipatory guidance provided for folliculitis.  Otherwise, Will treat based off of results.    Wallis Bamberg, [place] 09/28/23 4742

## 2023-09-28 NOTE — ED Notes (Signed)
In with Mani, PA-C for exam 

## 2023-09-29 LAB — CERVICOVAGINAL ANCILLARY ONLY
Chlamydia: NEGATIVE
Comment: NEGATIVE
Comment: NEGATIVE
Comment: NORMAL
Neisseria Gonorrhea: NEGATIVE
Trichomonas: NEGATIVE

## 2023-09-29 LAB — HIV ANTIBODY (ROUTINE TESTING W REFLEX): HIV Screen 4th Generation wRfx: NONREACTIVE

## 2023-09-29 LAB — RPR: RPR Ser Ql: NONREACTIVE

## 2023-10-02 LAB — HSV CULTURE AND TYPING

## 2023-12-08 ENCOUNTER — Ambulatory Visit
Admission: EM | Admit: 2023-12-08 | Discharge: 2023-12-08 | Disposition: A | Discharge status: Home / Self Care | Payer: BC Managed Care – PPO | Attending: Family Medicine | Admitting: Family Medicine

## 2023-12-08 DIAGNOSIS — Z113 Encounter for screening for infections with a predominantly sexual mode of transmission: Secondary | ICD-10-CM | POA: Diagnosis present

## 2023-12-08 DIAGNOSIS — L732 Hidradenitis suppurativa: Secondary | ICD-10-CM | POA: Insufficient documentation

## 2023-12-08 MED ORDER — DOXYCYCLINE HYCLATE 100 MG PO CAPS: 100.0000 mg | ORAL_CAPSULE | Freq: Two times a day (BID) | ORAL | 0 refills | Status: DC

## 2023-12-08 NOTE — Discharge Instructions (Signed)
Use warm compresses to the area. Apply for 5-10 minutes 3 times daily as your schedule allows. Start doxycycline for suspected hidradenitis infection.   If you develop a cluster of small painful blisters then return to our clinic immediately.

## 2023-12-08 NOTE — ED Provider Notes (Signed)
[facility]  Note:  This document was prepared using Conservation officer, historic buildings and may include unintentional dictation errors.  MRN: [id number] DOB: [birth year]  Subjective:   Pamela Arias is a 22 y.o. female presenting for concerns for sexually transmitted infection.  Feels like she saw a sore spot/lesion over the groin area, medial right thigh bordering the lateral labia majora.  Symptoms have since resolved.  Has a history of hidradenitis.  She just had unprotected sex, where make sure it was not HSV.  No current facility-administered medications for this encounter.  Current Outpatient Medications:    acetaminophen (TYLENOL) 500 MG tablet, Take 1,000 mg by mouth every 6 (six) hours as needed., Disp: , Rfl:    ibuprofen (ADVIL) 200 MG tablet, Take 200 mg by mouth every 6 (six) hours as needed., Disp: , Rfl:    medroxyPROGESTERone (PROVERA) 10 MG tablet, Take 10 mg by mouth daily. (Patient not taking: Reported on 09/30/2022), Disp: , Rfl:    polyethylene glycol powder (GLYCOLAX/MIRALAX) 17 GM/SCOOP powder, Mix one half bottle with 32 ounces of Gatorade.  Consume entire bottle within 1 hour.  May repeat in 6 hours if needed. (Patient not taking: Reported on 09/30/2022), Disp: 510 g, Rfl: 0   No Known Allergies  Past Medical History:  Diagnosis Date   Bacterial vaginosis    Hydradenitis    left arm     Past Surgical History:  Procedure Laterality Date   ACHILLES TENDON SURGERY Left 08/31/2018   IRRIGATION AND DEBRIDEMENT LEFT LOWER POSTERIOR  LEG (Left)   ACHILLES TENDON SURGERY Left 08/31/2018   Procedure: ACHILLES TENDON REPAIR LEFT;  Surgeon: Kathryne Hitch, MD;  Location: [facility];  Service: Orthopedics;  Laterality: Left;   hemorrhoid removed  2020   HYDRADENITIS EXCISION Left 02/27/2021   Procedure: EXCISION HIDRADENITIS LEFT AXILLA;  Surgeon: Andria Meuse, MD;  Location: [facility];  Service: General;   Laterality: Left;   I & D EXTREMITY Left 08/31/2018   Procedure: IRRIGATION AND DEBRIDEMENT LEFT LOWER POSTERIOR  LEG;  Surgeon: Kathryne Hitch, MD;  Location: [facility];  Service: Orthopedics;  Laterality: Left;   WOUND EXPLORATION Left 08/31/2018   Procedure: WOUND EXPLORATION LEFT LOWER POSTERIOR  LEG;  Surgeon: Kathryne Hitch, MD;  Location: [facility] OR;  Service: Orthopedics;  Laterality: Left;    Family History  Problem Relation Age of Onset   Rheum arthritis Mother     Social History   Tobacco Use   Smoking status: Never   Smokeless tobacco: Never  Vaping Use   Vaping status: Never Used  Substance Use Topics   Alcohol use: Yes    Comment: occ   Drug use: Yes    Types: Marijuana    ROS   Objective:   Vitals: BP (!) 154/72 (BP Location: Left Arm)   Pulse 77   Temp 98.6 F (37 C) (Oral)   Resp 16   LMP  (Within Days)   SpO2 98%   Physical Exam Constitutional:      General: She is not in acute distress.    Appearance: Normal appearance. She is well-developed. She is not ill-appearing, toxic-appearing or diaphoretic.  HENT:     Head: Normocephalic and atraumatic.     Nose: Nose normal.     Mouth/Throat:     Mouth: Mucous membranes are moist.  Eyes:     General: No scleral icterus.       Right eye: No  discharge.        Left eye: No discharge.     Extraocular Movements: Extraocular movements intact.  Cardiovascular:     Rate and Rhythm: Normal rate.  Pulmonary:     Effort: Pulmonary effort is normal.  Genitourinary:      Comments: Multiple well-healed hidradenitis scars. Skin:    General: Skin is warm and dry.  Neurological:     General: No focal deficit present.     Mental Status: She is alert and oriented to person, place, and time.  Psychiatric:        Mood and Affect: Mood normal.        Behavior: Behavior normal.     Assessment and Plan :   PDMP not reviewed this encounter.  1. Hidradenitis   2. Screen for STD (sexually  transmitted disease)    Vaginal cytology pending at patient's request for an STI check.  Recommended against HSV testing as there are no signs of this.  High suspicion for recurrent infectious hidradenitis.  Recommended doxycycline, supportive care.  Counseled patient on potential for adverse effects with medications prescribed/recommended today, ER and return-to-clinic precautions discussed, patient verbalized understanding.    Wallis Bamberg, [place] 12/08/23 1715

## 2023-12-08 NOTE — ED Triage Notes (Signed)
Pt requested STD's test as she saw a "patch: in her genitalia today. States the patch is sore.

## 2023-12-09 LAB — CERVICOVAGINAL ANCILLARY ONLY
Chlamydia: NEGATIVE
Comment: NEGATIVE
Comment: NEGATIVE
Comment: NORMAL
Neisseria Gonorrhea: NEGATIVE
Trichomonas: NEGATIVE

## 2024-01-23 ENCOUNTER — Ambulatory Visit
Admission: EM | Admit: 2024-01-23 | Discharge: 2024-01-23 | Disposition: A | Discharge status: Home / Self Care | Payer: BC Managed Care – PPO | Source: Home / Self Care

## 2024-01-23 DIAGNOSIS — R070 Pain in throat: Secondary | ICD-10-CM

## 2024-01-23 DIAGNOSIS — S1093XA Contusion of unspecified part of neck, initial encounter: Secondary | ICD-10-CM | POA: Insufficient documentation

## 2024-01-23 DIAGNOSIS — S5011XA Contusion of right forearm, initial encounter: Secondary | ICD-10-CM | POA: Insufficient documentation

## 2024-01-23 DIAGNOSIS — J36 Peritonsillar abscess: Secondary | ICD-10-CM | POA: Diagnosis not present

## 2024-01-23 LAB — POCT RAPID STREP A (OFFICE): Rapid Strep A Screen: NEGATIVE

## 2024-01-23 MED ORDER — CYCLOBENZAPRINE HCL 5 MG PO TABS: 5.0000 mg | ORAL_TABLET | Freq: Every evening | ORAL | 0 refills | Status: DC | PRN

## 2024-01-23 MED ORDER — NAPROXEN 375 MG PO TABS: 375.0000 mg | ORAL_TABLET | Freq: Two times a day (BID) | ORAL | 0 refills | Status: DC
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
## 2024-01-25 ENCOUNTER — Other Ambulatory Visit: Payer: Self-pay

## 2024-01-25 ENCOUNTER — Ambulatory Visit
Admission: EM | Admit: 2024-01-25 | Discharge: 2024-01-25 | Disposition: A | Discharge status: Home / Self Care | Payer: BC Managed Care – PPO | Attending: Family Medicine | Admitting: Family Medicine

## 2024-01-25 ENCOUNTER — Inpatient Hospital Stay (HOSPITAL_COMMUNITY)
Admission: EM | Admit: 2024-01-25 | Discharge: 2024-01-26 | DRG: 153 | Disposition: A | Discharge status: Home / Self Care | Payer: BC Managed Care – PPO | Attending: Internal Medicine | Admitting: Internal Medicine

## 2024-01-25 ENCOUNTER — Emergency Department (HOSPITAL_COMMUNITY): Payer: BC Managed Care – PPO

## 2024-01-25 ENCOUNTER — Encounter (HOSPITAL_COMMUNITY): Payer: Self-pay

## 2024-01-25 DIAGNOSIS — L732 Hidradenitis suppurativa: Secondary | ICD-10-CM | POA: Diagnosis present

## 2024-01-25 DIAGNOSIS — J36 Peritonsillar abscess: Secondary | ICD-10-CM

## 2024-01-25 DIAGNOSIS — D649 Anemia, unspecified: Secondary | ICD-10-CM | POA: Diagnosis present

## 2024-01-25 DIAGNOSIS — Z8261 Family history of arthritis: Secondary | ICD-10-CM | POA: Diagnosis not present

## 2024-01-25 DIAGNOSIS — D72829 Elevated white blood cell count, unspecified: Secondary | ICD-10-CM | POA: Diagnosis present

## 2024-01-25 LAB — CULTURE, GROUP A STREP (THRC)

## 2024-01-25 LAB — CBC WITH DIFFERENTIAL/PLATELET
Abs Immature Granulocytes: 0.12 10*3/uL — ABNORMAL HIGH (ref 0.00–0.07)
Basophils Absolute: 0 10*3/uL (ref 0.0–0.1)
Basophils Relative: 0 %
Eosinophils Absolute: 0.1 10*3/uL (ref 0.0–0.5)
Eosinophils Relative: 1 %
HCT: 34.5 % — ABNORMAL LOW (ref 36.0–46.0)
Hemoglobin: 10.7 g/dL — ABNORMAL LOW (ref 12.0–15.0)
Immature Granulocytes: 1 %
Lymphocytes Relative: 10 %
Lymphs Abs: 1.3 10*3/uL (ref 0.7–4.0)
MCH: 25.4 pg — ABNORMAL LOW (ref 26.0–34.0)
MCHC: 31 g/dL (ref 30.0–36.0)
MCV: 81.9 fL (ref 80.0–100.0)
Monocytes Absolute: 1.3 10*3/uL — ABNORMAL HIGH (ref 0.1–1.0)
Monocytes Relative: 10 %
Neutro Abs: 10.3 10*3/uL — ABNORMAL HIGH (ref 1.7–7.7)
Neutrophils Relative %: 78 %
Platelets: 367 10*3/uL (ref 150–400)
RBC: 4.21 MIL/uL (ref 3.87–5.11)
RDW: 14.6 % (ref 11.5–15.5)
WBC: 13.1 10*3/uL — ABNORMAL HIGH (ref 4.0–10.5)
nRBC: 0 % (ref 0.0–0.2)

## 2024-01-25 LAB — BASIC METABOLIC PANEL
Anion gap: 8 (ref 5–15)
BUN: 8 mg/dL (ref 6–20)
CO2: 25 mmol/L (ref 22–32)
Calcium: 9.3 mg/dL (ref 8.9–10.3)
Chloride: 104 mmol/L (ref 98–111)
Creatinine, Ser: 0.86 mg/dL (ref 0.44–1.00)
GFR, Estimated: >60 mL/min (ref >60)
Glucose, Bld: 97 mg/dL (ref 70–99)
Potassium: 3.5 mmol/L (ref 3.5–5.1)
Sodium: 137 mmol/L (ref 135–145)

## 2024-01-25 LAB — HCG, SERUM, QUALITATIVE: Preg, Serum: NEGATIVE

## 2024-01-25 MED ORDER — SODIUM CHLORIDE 0.9 % IV SOLN
3.0000 g | Freq: Once | INTRAVENOUS | Status: AC
  Administered 2024-01-25: 3 g via INTRAVENOUS
  Filled 2024-01-25: qty 8

## 2024-01-25 MED ORDER — ONDANSETRON HCL 4 MG PO TABS: 4.0000 mg | ORAL_TABLET | Freq: Four times a day (QID) | ORAL | Status: DC | PRN

## 2024-01-25 MED ORDER — IOHEXOL 300 MG/ML  SOLN
80.0000 mL | Freq: Once | INTRAMUSCULAR | Status: AC | PRN
  Administered 2024-01-25: 80 mL via INTRAVENOUS

## 2024-01-25 MED ORDER — HYDROMORPHONE HCL 1 MG/ML IJ SOLN
0.5000 mg | Freq: Once | INTRAMUSCULAR | Status: AC
  Administered 2024-01-25: 0.5 mg via INTRAVENOUS
  Filled 2024-01-25: qty 1

## 2024-01-25 MED ORDER — TRAZODONE HCL 50 MG PO TABS: 50.0000 mg | ORAL_TABLET | Freq: Every evening | ORAL | Status: DC | PRN

## 2024-01-25 MED ORDER — DEXAMETHASONE SODIUM PHOSPHATE 10 MG/ML IJ SOLN
10.0000 mg | Freq: Once | INTRAMUSCULAR | Status: AC
  Administered 2024-01-25: 10 mg via INTRAVENOUS
  Filled 2024-01-25: qty 1

## 2024-01-25 MED ORDER — SODIUM CHLORIDE 0.9 % IV SOLN
3.0000 g | Freq: Four times a day (QID) | INTRAVENOUS | Status: DC
  Administered 2024-01-25 – 2024-01-26 (×4): 3 g via INTRAVENOUS
  Filled 2024-01-25 (×5): qty 8

## 2024-01-25 MED ORDER — ACETAMINOPHEN 650 MG RE SUPP: 650.0000 mg | Freq: Four times a day (QID) | RECTAL | Status: DC | PRN

## 2024-01-25 MED ORDER — DIPHENHYDRAMINE HCL 50 MG/ML IJ SOLN
25.0000 mg | Freq: Once | INTRAMUSCULAR | Status: AC
  Administered 2024-01-25: 25 mg via INTRAVENOUS
  Filled 2024-01-25: qty 1

## 2024-01-25 MED ORDER — OXYCODONE HCL 5 MG PO TABS
5.0000 mg | ORAL_TABLET | ORAL | Status: DC | PRN
  Filled 2024-01-25: qty 1

## 2024-01-25 MED ORDER — ACETAMINOPHEN 325 MG PO TABS: 650.0000 mg | ORAL_TABLET | Freq: Four times a day (QID) | ORAL | Status: DC | PRN

## 2024-01-25 MED ORDER — FENTANYL CITRATE PF 50 MCG/ML IJ SOSY
50.0000 ug | PREFILLED_SYRINGE | Freq: Once | INTRAMUSCULAR | Status: AC
  Administered 2024-01-25: 50 ug via INTRAVENOUS
  Filled 2024-01-25: qty 1

## 2024-01-25 MED ORDER — ALBUTEROL SULFATE (2.5 MG/3ML) 0.083% IN NEBU: 2.5000 mg | INHALATION_SOLUTION | RESPIRATORY_TRACT | Status: DC | PRN

## 2024-01-25 MED ORDER — ONDANSETRON HCL 4 MG/2ML IJ SOLN: 4.0000 mg | Freq: Four times a day (QID) | INTRAMUSCULAR | Status: DC | PRN

## 2024-01-25 MED ORDER — ENOXAPARIN SODIUM 40 MG/0.4ML IJ SOSY
40.0000 mg | PREFILLED_SYRINGE | INTRAMUSCULAR | Status: DC
  Filled 2024-01-25: qty 0.4

## 2024-01-25 MED ORDER — HYDROMORPHONE HCL 1 MG/ML IJ SOLN: 0.5000 mg | INTRAMUSCULAR | Status: DC | PRN

## 2024-01-25 MED ORDER — SODIUM CHLORIDE 0.9 % IV BOLUS
1000.0000 mL | Freq: Once | INTRAVENOUS | Status: AC
  Administered 2024-01-25: 1000 mL via INTRAVENOUS

## 2024-01-25 NOTE — ED Triage Notes (Signed)
Pt arrived from [facility] reporting was sent over for peritonsillar abscess. States started two days ago. Painful to swallow. Denies any shob or fevers. Airway patent. NAD noted

## 2024-01-25 NOTE — Progress Notes (Signed)
Pharmacy Antibiotic Note  Pamela Arias is a 23 y.o. female admitted on 01/25/2024 with peritonsillar abscess .  Pharmacy has been consulted for Unasyn dosing.  Plan: Unasyn 3 gr IV q6h   Height: 5\' 6"  (167.6 cm) Weight: 69.5 kg (153 lb 3.5 oz) IBW/kg (Calculated) : 59.3  Temp (24hrs), Avg:98.9 F (37.2 C), Min:98.4 F (36.9 C), Max:99.4 F (37.4 C)  Recent Labs  Lab 01/25/24 1038  WBC 13.1*  CREATININE 0.86    Estimated Creatinine Clearance: 94.4 mL/min (by C-G formula based on SCr of 0.86 mg/dL).    No Known Allergies  Antimicrobials this admission: 2/12 Unasyn >>    Dosage will likely remain stable at above dosage and need for further dosage adjustment appears unlikely at present.    Will sign off at this time.  Please reconsult if a change in clinical status warrants re-evaluation of dosage.    Adalberto Cole, PharmD, BCPS 01/25/2024 2:49 PM

## 2024-01-25 NOTE — ED Notes (Signed)
Patient transported to CT

## 2024-01-25 NOTE — Plan of Care (Signed)

## 2024-01-25 NOTE — ED Notes (Signed)
RN called [employer] operational officer to let them know that patient is about to be transported to 1338

## 2024-01-25 NOTE — ED Provider Notes (Signed)
[place] EMERGENCY DEPARTMENT AT [facility] Provider Note   CSN: [id number] Arrival date & time: 01/25/24  4098     History  Chief Complaint  Patient presents with   Abscess    Pamela Arias is a 23 y.o. female.   Abscess Patient has had for peritonsillar abscess.  Has sore throat for few days.  Decreased oral intake.  States some difficulty speaking to.  Denies fevers.  Seen in urgent care and sent here.  Reported recent negative strep test.  Does not think she is pregnant.    Past Medical History:  Diagnosis Date   Bacterial vaginosis    Hydradenitis    left arm    Home Medications Prior to Admission medications   Medication Sig Start Date End Date Taking? Authorizing Provider  acetaminophen (TYLENOL) 500 MG tablet Take 1,000 mg by mouth every 6 (six) hours as needed.    [provider]  cyclobenzaprine (FLEXERIL) 5 MG tablet Take 1 tablet (5 mg total) by mouth at bedtime as needed. 01/23/24   Wallis Bamberg, PA-C  doxycycline (VIBRAMYCIN) 100 MG capsule Take 1 capsule (100 mg total) by mouth 2 (two) times daily. 12/08/23   Wallis Bamberg, PA-C  ibuprofen (ADVIL) 200 MG tablet Take 200 mg by mouth every 6 (six) hours as needed.    [provider]  medroxyPROGESTERone (PROVERA) 10 MG tablet Take 10 mg by mouth daily. Patient not taking: Reported on 09/30/2022 06/06/22   [provider]  naproxen (NAPROSYN) 375 MG tablet Take 1 tablet (375 mg total) by mouth 2 (two) times daily with a meal. 01/23/24   Wallis Bamberg, PA-C  polyethylene glycol powder (GLYCOLAX/MIRALAX) 17 GM/SCOOP powder Mix one half bottle with 32 ounces of Gatorade.  Consume entire bottle within 1 hour.  May repeat in 6 hours if needed. Patient not taking: Reported on 09/30/2022 06/25/22   Theadora Rama Scales, PA-C      Allergies    Patient has no known allergies.    Review of Systems   Review of Systems  Physical Exam Updated Vital Signs BP 137/79 (BP Location:  Right Arm)   Pulse 69   Temp 98.8 F (37.1 C) (Oral)   Resp 16   Ht 5\' 6"  (1.676 m)   Wt 69.5 kg   LMP 01/15/2024 (Approximate)   SpO2 100%   BMI 24.73 kg/m  Physical Exam Vitals and nursing note reviewed.  HENT:     Mouth/Throat:     Comments:  posterior pharyngeal erythema.  Swelling on left side and peritonsillar area.  Some trismus. Cardiovascular:     Rate and Rhythm: Regular rhythm.  Musculoskeletal:     Cervical back: Neck supple.  Neurological:     Mental Status: She is alert.     ED Results / Procedures / Treatments   Labs (all labs ordered are listed, but only abnormal results are displayed) Labs Reviewed  CBC WITH DIFFERENTIAL/PLATELET - Abnormal; Notable for the following components:      Result Value   WBC 13.1 (*)    Hemoglobin 10.7 (*)    HCT 34.5 (*)    MCH 25.4 (*)    Neutro Abs 10.3 (*)    Monocytes Absolute 1.3 (*)    Abs Immature Granulocytes 0.12 (*)    All other components within normal limits  BASIC METABOLIC PANEL  HCG, SERUM, QUALITATIVE    EKG None  Radiology CT Soft Tissue Neck W Contrast Result Date: 01/25/2024 CLINICAL DATA:  Epiglottitis or tonsillitis suspected EXAM: CT NECK WITH CONTRAST TECHNIQUE: Multidetector CT imaging of the neck was performed using the standard protocol following the bolus administration of intravenous contrast. RADIATION DOSE REDUCTION: This exam was performed according to the departmental dose-optimization program which includes automated exposure control, adjustment of the mA and/or kV according to patient size and/or use of iterative reconstruction technique. CONTRAST:  80mL OMNIPAQUE IOHEXOL 300 MG/ML  SOLN COMPARISON:  None Available. FINDINGS: Pharynx and larynx: Epiglottis is normal in appearance. Bilateral tonsillar hypertrophy with phlegmonous change on the left with a 2.1 x 1.2 x 0.7 cm peritonsillar abscess. There is a small retropharyngeal effusion without a retropharyngeal abscess. Salivary glands: No  inflammation, mass, or stone. Thyroid: Normal. Lymph nodes: Asymmetrically prominent cervical lymph nodes in the left neck, measuring up to 1.5 cm in the left 2A station. These are favored to be reactive. Vascular: Negative. Limited intracranial: Negative. Visualized orbits: Negative. Mastoids and visualized paranasal sinuses: No middle ear or mastoid effusion. Paranasal sinuses are clear. Orbits are unremarkable. Skeleton: No acute or aggressive process. Upper chest: Negative. Other: None. IMPRESSION: 1. Findings compatible with tonsillitis and a 2.1 x 1.2 x 0.7 cm peritonsillar abscess on the left. 2. Small retropharyngeal effusion without a retropharyngeal abscess. 3. Reactive cervical lymphadenopathy. Electronically Signed   By: Lorenza Cambridge M.D.   On: 01/25/2024 12:40    Procedures Procedures    Medications Ordered in ED Medications  dexamethasone (DECADRON) injection 10 mg (has no administration in time range)  sodium chloride 0.9 % bolus 1,000 mL (0 mLs Intravenous Stopped 01/25/24 1231)  fentaNYL (SUBLIMAZE) injection 50 mcg (50 mcg Intravenous Given 01/25/24 1046)  iohexol (OMNIPAQUE) 300 MG/ML solution 80 mL (80 mLs Intravenous Contrast Given 01/25/24 1157)  Ampicillin-Sulbactam (UNASYN) 3 g in sodium chloride 0.9 % 100 mL IVPB (0 g Intravenous Stopped 01/25/24 1317)  HYDROmorphone (DILAUDID) injection 0.5 mg (0.5 mg Intravenous Given 01/25/24 1250)    ED Course/ Medical Decision Making/ A&P                                 Medical Decision Making Amount and/or Complexity of Data Reviewed Labs: ordered. Radiology: ordered.  Risk Prescription drug management.     Patient with sore throat.  Has likely peritonsillar abscess versus other infection of this area.  Will get basic blood work and CT scan to evaluate.  White count elevated at 13.  Patient is not pregnant.  CT scan done and does show peritonsillar abscess.  Discussed with Dr. Jearld Fenton from ENT.  Does not think it needs  immediate drainage.  However with pain uncontrolled with oral medicine will bring him to the hospital.  Will get IV antibiotics pain meds fluids and steroids.  Tomorrow will be examined and if needs drainage can be done at that time.  Patient can stay at [facility] long.  Will discuss with hospitalist for admission.        Final Clinical Impression(s) / ED Diagnoses Final diagnoses:  Peritonsillar abscess    Rx / DC Orders ED Discharge Orders     None         Benjiman Core, MD 01/25/24 1423

## 2024-01-25 NOTE — ED Notes (Signed)
RN gave Decadron patient immediately begin to c/o itching and burning all over Dr Rubin Payor contacted verbal order for 25mg  IV Benadryl

## 2024-01-25 NOTE — ED Notes (Signed)
 ED TO INPATIENT HANDOFF REPORT  ED Nurse Name and Phone #: Crist Infante, RN [phone]  S Name/Age/Gender Pamela Arias 23 y.o. female Room/Bed: WA16/WA16  Code Status   Code Status: Full Code  Home/SNF/Other Home Patient oriented to: self, place, time, and situation Is this baseline? Yes   Triage Complete: Triage complete  Chief Complaint Peritonsillar abscess [J36]  Triage Note Pt arrived from UC reporting was sent over for peritonsillar abscess. States started two days ago. Painful to swallow. Denies any shob or fevers. Airway patent. NAD noted   Allergies No Known Allergies  Level of Care/Admitting Diagnosis ED Disposition     ED Disposition  Admit   Condition  --   Comment  Hospital Area: [facility] [[id number]]  Level of Care: Med-Surg [16]  May admit patient to [facility] or [facility] if equivalent level of care is available:: Yes  Covid Evaluation: Asymptomatic - no recent exposure (last 10 days) testing not required  Diagnosis: Peritonsillar abscess [475.ICD-9-CM]  Admitting Physician: Maryln Gottron [[id number]]  Attending Physician: Kirby Crigler, MIR [email or web address] [[id number]]  Certification:: I certify this patient will need inpatient services for at least 2 midnights  Expected Medical Readiness: 01/27/2024          B Medical/Surgery History Past Medical History:  Diagnosis Date   Bacterial vaginosis    Hydradenitis    left arm   Past Surgical History:  Procedure Laterality Date   ACHILLES TENDON SURGERY Left 08/31/2018   IRRIGATION AND DEBRIDEMENT LEFT LOWER POSTERIOR  LEG (Left)   ACHILLES TENDON SURGERY Left 08/31/2018   Procedure: ACHILLES TENDON REPAIR LEFT;  Surgeon: Kathryne Hitch, MD;  [facility];  Service: Orthopedics;  Laterality: Left;   hemorrhoid removed  2020   HYDRADENITIS EXCISION Left 02/27/2021   Procedure: EXCISION HIDRADENITIS LEFT AXILLA;  Surgeon: Andria Meuse, MD;  Location: [facility];  Service: General;  Laterality: Left;   I & D EXTREMITY Left 08/31/2018   Procedure: IRRIGATION AND DEBRIDEMENT LEFT LOWER POSTERIOR  LEG;  Surgeon: Kathryne Hitch, MD;  [facility]: [facility];  Service: Orthopedics;  Laterality: Left;   WOUND EXPLORATION Left 08/31/2018   Procedure: WOUND EXPLORATION LEFT LOWER POSTERIOR  LEG;  Surgeon: Kathryne Hitch, MD;  [facility];  Service: Orthopedics;  Laterality: Left;     A IV Location/Drains/Wounds Patient Lines/Drains/Airways Status     Active Line/Drains/Airways     Name Placement date Placement time Site Days   Peripheral IV 01/25/24 20 G 1" Left Antecubital 01/25/24  1039  Antecubital  less than 1            Intake/Output Last 24 hours  Intake/Output Summary (Last 24 hours) at 01/25/2024 1454 Last data filed at 01/25/2024 1317 Gross per 24 hour  Intake 1100.38 ml  Output --  Net 1100.38 ml    Labs/Imaging Results for orders placed or performed during the hospital encounter of 01/25/24 (from the past 48 hours)  Basic metabolic panel     Status: None   Collection Time: 01/25/24 10:38 AM  Result Value Ref Range   Sodium 137 135 - 145 mmol/L   Potassium 3.5 3.5 - 5.1 mmol/L   Chloride 104 98 - 111 mmol/L   CO2 25 22 - 32 mmol/L   Glucose, Bld 97 70 - 99 mg/dL    Comment: Glucose reference range applies only to samples taken after fasting for at least 8 hours.   BUN 8 6 -  20 mg/dL   Creatinine, Ser 4.09 0.44 - 1.00 mg/dL   Calcium 9.3 8.9 - 81.1 mg/dL   GFR, Estimated >91 >47 mL/min    Comment: (NOTE) Calculated using the CKD-EPI Creatinine Equation (2021)    Anion gap 8 5 - 15    Comment: Performed at [facility], [address] [place], [place] [address]  CBC with Differential     Status: Abnormal   Collection Time: 01/25/24 10:38 AM  Result Value Ref Range   WBC 13.1 (H) 4.0 - 10.5 K/uL   RBC 4.21 3.87 - 5.11 MIL/uL   Hemoglobin 10.7 (L) 12.0 - 15.0 g/dL   HCT 21.3 (L)  08.6 - 46.0 %   MCV 81.9 80.0 - 100.0 fL   MCH 25.4 (L) 26.0 - 34.0 pg   MCHC 31.0 30.0 - 36.0 g/dL   RDW 57.8 46.9 - 62.9 %   Platelets 367 150 - 400 K/uL   nRBC 0.0 0.0 - 0.2 %   Neutrophils Relative % 78 %   Neutro Abs 10.3 (H) 1.7 - 7.7 K/uL   Lymphocytes Relative 10 %   Lymphs Abs 1.3 0.7 - 4.0 K/uL   Monocytes Relative 10 %   Monocytes Absolute 1.3 (H) 0.1 - 1.0 K/uL   Eosinophils Relative 1 %   Eosinophils Absolute 0.1 0.0 - 0.5 K/uL   Basophils Relative 0 %   Basophils Absolute 0.0 0.0 - 0.1 K/uL   Immature Granulocytes 1 %   Abs Immature Granulocytes 0.12 (H) 0.00 - 0.07 K/uL    Comment: Performed at [facility], [address] [place], [place] [address]  hCG, serum, qualitative     Status: None   Collection Time: 01/25/24 10:38 AM  Result Value Ref Range   Preg, Serum NEGATIVE NEGATIVE    Comment:        THE SENSITIVITY OF THIS METHODOLOGY IS >10 mIU/mL. Performed at [facility], [address], [place], [place] [address]    CT Soft Tissue Neck W Contrast Result Date: 01/25/2024 CLINICAL DATA:  Epiglottitis or tonsillitis suspected EXAM: CT NECK WITH CONTRAST TECHNIQUE: Multidetector CT imaging of the neck was performed using the standard protocol following the bolus administration of intravenous contrast. RADIATION DOSE REDUCTION: This exam was performed according to the departmental dose-optimization program which includes automated exposure control, adjustment of the mA and/or kV according to patient size and/or use of iterative reconstruction technique. CONTRAST:  80mL OMNIPAQUE IOHEXOL 300 MG/ML  SOLN COMPARISON:  None Available. FINDINGS: Pharynx and larynx: Epiglottis is normal in appearance. Bilateral tonsillar hypertrophy with phlegmonous change on the left with a 2.1 x 1.2 x 0.7 cm peritonsillar abscess. There is a small retropharyngeal effusion without a retropharyngeal abscess. Salivary glands: No inflammation, mass, or  stone. Thyroid: Normal. Lymph nodes: Asymmetrically prominent cervical lymph nodes in the left neck, measuring up to 1.5 cm in the left 2A station. These are favored to be reactive. Vascular: Negative. Limited intracranial: Negative. Visualized orbits: Negative. Mastoids and visualized paranasal sinuses: No middle ear or mastoid effusion. Paranasal sinuses are clear. Orbits are unremarkable. Skeleton: No acute or aggressive process. Upper chest: Negative. Other: None. IMPRESSION: 1. Findings compatible with tonsillitis and a 2.1 x 1.2 x 0.7 cm peritonsillar abscess on the left. 2. Small retropharyngeal effusion without a retropharyngeal abscess. 3. Reactive cervical lymphadenopathy. Electronically Signed   By: Lorenza Cambridge M.D.   On: 01/25/2024 12:40    Pending Labs [employer] (From admission,  onward)     Start     Ordered   01/26/24 0500  Basic metabolic panel  Tomorrow morning,   R        01/25/24 1438   01/26/24 0500  CBC  Tomorrow morning,   R        01/25/24 1438            Vitals/Pain Today's Vitals   01/25/24 0947 01/25/24 1107 01/25/24 1317 01/25/24 1351  BP:    137/79  Pulse:    69  Resp:    16  Temp:    98.8 F (37.1 C)  TempSrc:    Oral  SpO2:    100%  Weight: 69.5 kg     Height: 5\' 6"  (1.676 m)     PainSc:  Asleep 5      Isolation Precautions No active isolations  Medications Medications  enoxaparin (LOVENOX) injection 40 mg (has no administration in time range)  acetaminophen (TYLENOL) tablet 650 mg (has no administration in time range)    Or  acetaminophen (TYLENOL) suppository 650 mg (has no administration in time range)  oxyCODONE (Oxy IR/ROXICODONE) immediate release tablet 5 mg (has no administration in time range)  HYDROmorphone (DILAUDID) injection 0.5-1 mg (has no administration in time range)  traZODone (DESYREL) tablet 50 mg (has no administration in time range)  ondansetron (ZOFRAN) tablet 4 mg (has no administration in time range)    Or   ondansetron (ZOFRAN) injection 4 mg (has no administration in time range)  albuterol (PROVENTIL) (2.5 MG/3ML) 0.083% nebulizer solution 2.5 mg (has no administration in time range)  Ampicillin-Sulbactam (UNASYN) 3 g in sodium chloride 0.9 % 100 mL IVPB (has no administration in time range)  sodium chloride 0.9 % bolus 1,000 mL (0 mLs Intravenous Stopped 01/25/24 1231)  fentaNYL (SUBLIMAZE) injection 50 mcg (50 mcg Intravenous Given 01/25/24 1046)  iohexol (OMNIPAQUE) 300 MG/ML solution 80 mL (80 mLs Intravenous Contrast Given 01/25/24 1157)  Ampicillin-Sulbactam (UNASYN) 3 g in sodium chloride 0.9 % 100 mL IVPB (0 g Intravenous Stopped 01/25/24 1317)  HYDROmorphone (DILAUDID) injection 0.5 mg (0.5 mg Intravenous Given 01/25/24 1250)  dexamethasone (DECADRON) injection 10 mg (10 mg Intravenous Given 01/25/24 1434)  diphenhydrAMINE (BENADRYL) injection 25 mg (25 mg Intravenous Given 01/25/24 1441)    Mobility walks     Focused Assessments Neuro Assessment Handoff:  Swallow screen pass?  N/A         Neuro Assessment:   Neuro Checks:      Has TPA been given? No If patient is a Neuro Trauma and patient is going to OR before floor call report to 4N Charge nurse: [phone] or [phone]   R Recommendations: See Admitting Provider Note  Report given to:   Additional Notes:

## 2024-01-25 NOTE — ED Notes (Signed)
Patient is being discharged from the Urgent Care and sent to the Emergency Department via POV Per Cheri Rous NP, patient is in need of higher level of care due to PT abscess. Patient is aware and verbalizes understanding of plan of care.  Vitals:   01/25/24 0836  BP: 129/86  Pulse: 86  Resp: 17  Temp: 98.4 F (36.9 C)  SpO2: 95%

## 2024-01-25 NOTE — Consult Note (Signed)
 Reason for Consult:tonsil infection Referring Physician: Dr Lorelee Market is an 23 y.o. female.  HPI: hx of 4 days of sore throat. No breathing issues. No previous. She is having trouble swallowing. CT scan with 2 cm abscess  Past Medical History:  Diagnosis Date   Bacterial vaginosis    Hydradenitis    left arm    Past Surgical History:  Procedure Laterality Date   ACHILLES TENDON SURGERY Left 08/31/2018   IRRIGATION AND DEBRIDEMENT LEFT LOWER POSTERIOR  LEG (Left)   ACHILLES TENDON SURGERY Left 08/31/2018   Procedure: ACHILLES TENDON REPAIR LEFT;  Surgeon: Kathryne Hitch, MD;  [facility];  Service: Orthopedics;  Laterality: Left;   hemorrhoid removed  2020   HYDRADENITIS EXCISION Left 02/27/2021   Procedure: EXCISION HIDRADENITIS LEFT AXILLA;  Surgeon: Andria Meuse, MD;  Location: [facility];  Service: General;  Laterality: Left;   I & D EXTREMITY Left 08/31/2018   Procedure: IRRIGATION AND DEBRIDEMENT LEFT LOWER POSTERIOR  LEG;  Surgeon: Kathryne Hitch, MD;  [facility]: [facility];  Service: Orthopedics;  Laterality: Left;   WOUND EXPLORATION Left 08/31/2018   Procedure: WOUND EXPLORATION LEFT LOWER POSTERIOR  LEG;  Surgeon: Kathryne Hitch, MD;  Location[facility];  Service: Orthopedics;  Laterality: Left;    Family History  Problem Relation Age of Onset   Rheum arthritis Mother     Social History:  reports that she has never smoked. She has never used smokeless tobacco. She reports current alcohol use. She reports that she does not currently use drugs after having used the following drugs: Marijuana.  Allergies: No Known Allergies  Medications: I have reviewed the patient's current medications.  Results for orders placed or performed during the hospital encounter of 01/25/24 (from the past 48 hours)  Basic metabolic panel     Status: None   Collection Time: 01/25/24 10:38 AM  Result Value Ref Range   Sodium 137  135 - 145 mmol/L   Potassium 3.5 3.5 - 5.1 mmol/L   Chloride 104 98 - 111 mmol/L   CO2 25 22 - 32 mmol/L   Glucose, Bld 97 70 - 99 mg/dL    Comment: Glucose reference range applies only to samples taken after fasting for at least 8 hours.   BUN 8 6 - 20 mg/dL   Creatinine, Ser 1.61 0.44 - 1.00 mg/dL   Calcium 9.3 8.9 - 09.6 mg/dL   GFR, Estimated >04 >54 mL/min    Comment: (NOTE) Calculated using the CKD-EPI Creatinine Equation (2021)    Anion gap 8 5 - 15    Comment: Performed at [facility], [address] [place], [place] [address]  CBC with Differential     Status: Abnormal   Collection Time: 01/25/24 10:38 AM  Result Value Ref Range   WBC 13.1 (H) 4.0 - 10.5 K/uL   RBC 4.21 3.87 - 5.11 MIL/uL   Hemoglobin 10.7 (L) 12.0 - 15.0 g/dL   HCT 91.4 (L) 78.2 - 95.6 %   MCV 81.9 80.0 - 100.0 fL   MCH 25.4 (L) 26.0 - 34.0 pg   MCHC 31.0 30.0 - 36.0 g/dL   RDW 21.3 08.6 - 57.8 %   Platelets 367 150 - 400 K/uL   nRBC 0.0 0.0 - 0.2 %   Neutrophils Relative % 78 %   Neutro Abs 10.3 (H) 1.7 - 7.7 K/uL   Lymphocytes Relative 10 %   Lymphs Abs 1.3 0.7 -  4.0 K/uL   Monocytes Relative 10 %   Monocytes Absolute 1.3 (H) 0.1 - 1.0 K/uL   Eosinophils Relative 1 %   Eosinophils Absolute 0.1 0.0 - 0.5 K/uL   Basophils Relative 0 %   Basophils Absolute 0.0 0.0 - 0.1 K/uL   Immature Granulocytes 1 %   Abs Immature Granulocytes 0.12 (H) 0.00 - 0.07 K/uL    Comment: Performed at [facility], [address] [place], [place] [address]  hCG, serum, qualitative     Status: None   Collection Time: 01/25/24 10:38 AM  Result Value Ref Range   Preg, Serum NEGATIVE NEGATIVE    Comment:        THE SENSITIVITY OF THIS METHODOLOGY IS >10 mIU/mL. Performed at [facility], [address] [place], [place] [address]     CT Soft Tissue Neck W Contrast Result Date: 01/25/2024 CLINICAL DATA:  Epiglottitis or tonsillitis suspected EXAM: CT NECK  WITH CONTRAST TECHNIQUE: Multidetector CT imaging of the neck was performed using the standard protocol following the bolus administration of intravenous contrast. RADIATION DOSE REDUCTION: This exam was performed according to the departmental dose-optimization program which includes automated exposure control, adjustment of the mA and/or kV according to patient size and/or use of iterative reconstruction technique. CONTRAST:  80mL OMNIPAQUE IOHEXOL 300 MG/ML  SOLN COMPARISON:  None Available. FINDINGS: Pharynx and larynx: Epiglottis is normal in appearance. Bilateral tonsillar hypertrophy with phlegmonous change on the left with a 2.1 x 1.2 x 0.7 cm peritonsillar abscess. There is a small retropharyngeal effusion without a retropharyngeal abscess. Salivary glands: No inflammation, mass, or stone. Thyroid: Normal. Lymph nodes: Asymmetrically prominent cervical lymph nodes in the left neck, measuring up to 1.5 cm in the left 2A station. These are favored to be reactive. Vascular: Negative. Limited intracranial: Negative. Visualized orbits: Negative. Mastoids and visualized paranasal sinuses: No middle ear or mastoid effusion. Paranasal sinuses are clear. Orbits are unremarkable. Skeleton: No acute or aggressive process. Upper chest: Negative. Other: None. IMPRESSION: 1. Findings compatible with tonsillitis and a 2.1 x 1.2 x 0.7 cm peritonsillar abscess on the left. 2. Small retropharyngeal effusion without a retropharyngeal abscess. 3. Reactive cervical lymphadenopathy. Electronically Signed   By: Lorenza Cambridge M.D.   On: 01/25/2024 12:40    ROS Blood pressure 114/75, pulse 61, temperature 98.6 F (37 C), temperature source Oral, resp. rate 16, height 5\' 6"  (1.676 m), weight 69.5 kg, last menstrual period 01/15/2024, SpO2 100%. Physical Exam HENT:     Nose: Nose normal.     Mouth/Throat:     Comments: Voice normal. Tongue without swelling. The left soft palate and tonsil bulging slightly and erythematous.  Opens mouth well. Eyes:     Pupils: Pupils are equal, round, and reactive to light.  Musculoskeletal:     Cervical back: Normal range of motion.  Neurological:     Mental Status: She is alert.       Assessment/Plan: Left PTA- she has a relatively small abscess so will try IV therapy. Will check her tomorrow.   Suzanna Obey 01/25/2024, 4:40 PM

## 2024-01-25 NOTE — ED Triage Notes (Signed)
Pt presents with c/o sore throat, states it has worsened in the last day. Pt states she was tested for Strep, awaiting culture results. Pt reports having night sweats over night.

## 2024-01-25 NOTE — Discharge Instructions (Signed)
Go to the ER for further eval ration and treatment of your peritonsillar abscess

## 2024-01-25 NOTE — ED Provider Notes (Signed)
 [facility]    CSN: [id number] Arrival date & time: 01/25/24  0825      History   Chief Complaint Chief Complaint  Patient presents with   Sore Throat    HPI Pamela Arias is a 23 y.o. female presents for sore throat.  Patient reports 3 to 4 days of a worsening sore throat.  Reports chills but denies fevers, cough, congestion, ear pain, body aches, shortness of breath.  Was seen in urgent care on 2/10 for same complaint and had a negative rapid strep with current throat culture pending.  Patient states symptoms of rapidly worsened over the past 24 hours and she has a difficult time swallowing.  No OTC medications have been used for symptoms.   Sore Throat    Past Medical History:  Diagnosis Date   Bacterial vaginosis    Hydradenitis    left arm    Patient Active Problem List   Diagnosis Date Noted   Achilles tendon injury 08/31/2018   Achilles rupture, left, initial encounter 08/31/2018    Past Surgical History:  Procedure Laterality Date   ACHILLES TENDON SURGERY Left 08/31/2018   IRRIGATION AND DEBRIDEMENT LEFT LOWER POSTERIOR  LEG (Left)   ACHILLES TENDON SURGERY Left 08/31/2018   Procedure: ACHILLES TENDON REPAIR LEFT;  Surgeon: Kathryne Hitch, MD;  [facility];  Service: Orthopedics;  Laterality: Left;   hemorrhoid removed  2020   HYDRADENITIS EXCISION Left 02/27/2021   Procedure: EXCISION HIDRADENITIS LEFT AXILLA;  Surgeon: Andria Meuse, MD;  Location: [facility];  Service: General;  Laterality: Left;   I & D EXTREMITY Left 08/31/2018   Procedure: IRRIGATION AND DEBRIDEMENT LEFT LOWER POSTERIOR  LEG;  Surgeon: Kathryne Hitch, MD;  [facility];  Service: Orthopedics;  Laterality: Left;   WOUND EXPLORATION Left 08/31/2018   Procedure: WOUND EXPLORATION LEFT LOWER POSTERIOR  LEG;  Surgeon: Kathryne Hitch, MD;  [facility];  Service: Orthopedics;  Laterality: Left;    OB History   No  obstetric history on file.      Home Medications    Prior to Admission medications   Medication Sig Start Date End Date Taking? Authorizing Provider  acetaminophen (TYLENOL) 500 MG tablet Take 1,000 mg by mouth every 6 (six) hours as needed.    [provider]  cyclobenzaprine (FLEXERIL) 5 MG tablet Take 1 tablet (5 mg total) by mouth at bedtime as needed. 01/23/24   Wallis Bamberg, PA-C  doxycycline (VIBRAMYCIN) 100 MG capsule Take 1 capsule (100 mg total) by mouth 2 (two) times daily. 12/08/23   Wallis Bamberg, PA-C  ibuprofen (ADVIL) 200 MG tablet Take 200 mg by mouth every 6 (six) hours as needed.    [provider]  medroxyPROGESTERone (PROVERA) 10 MG tablet Take 10 mg by mouth daily. Patient not taking: Reported on 09/30/2022 06/06/22   [provider]  naproxen (NAPROSYN) 375 MG tablet Take 1 tablet (375 mg total) by mouth 2 (two) times daily with a meal. 01/23/24   Wallis Bamberg, PA-C  polyethylene glycol powder (GLYCOLAX/MIRALAX) 17 GM/SCOOP powder Mix one half bottle with 32 ounces of Gatorade.  Consume entire bottle within 1 hour.  May repeat in 6 hours if needed. Patient not taking: Reported on 09/30/2022 06/25/22   Theadora Rama Scales, PA-C    Family History Family History  Problem Relation Age of Onset   Rheum arthritis Mother     Social History Social History   Tobacco Use  Smoking status: Never   Smokeless tobacco: Never  Vaping Use   Vaping status: Never Used  Substance Use Topics   Alcohol use: Yes    Comment: occ   Drug use: Not Currently    Types: Marijuana     Allergies   Patient has no known allergies.   Review of Systems Review of Systems  HENT:  Positive for sore throat.      Physical Exam Triage Vital Signs ED Triage Vitals  Encounter Vitals Group     BP 01/25/24 0836 129/86     Systolic BP Percentile --      Diastolic BP Percentile --      Pulse Rate 01/25/24 0836 86     Resp 01/25/24 0836 17     Temp 01/25/24  0836 98.4 F (36.9 C)     Temp Source 01/25/24 0836 Oral     SpO2 01/25/24 0836 95 %     Weight --      Height --      Head Circumference --      Peak Flow --      Pain Score 01/25/24 0835 8     Pain Loc --      Pain Education --      Exclude from Growth Chart --    No data found.  Updated Vital Signs BP 129/86 (BP Location: Right Arm)   Pulse 86   Temp 98.4 F (36.9 C) (Oral)   Resp 17   LMP 01/15/2024 (Approximate)   SpO2 95%   Visual Acuity Right Eye Distance:   Left Eye Distance:   Bilateral Distance:    Right Eye Near:   Left Eye Near:    Bilateral Near:     Physical Exam Vitals and nursing note reviewed.  Constitutional:      General: She is not in acute distress.    Appearance: She is well-developed. She is not ill-appearing.  HENT:     Head: Normocephalic and atraumatic.     Right Ear: Tympanic membrane and ear canal normal.     Left Ear: Tympanic membrane and ear canal normal.     Nose: No congestion or rhinorrhea.     Mouth/Throat:     Mouth: Mucous membranes are moist.     Pharynx: Oropharynx is clear. Uvula midline. Posterior oropharyngeal erythema present.     Tonsils: Tonsillar abscess present. No tonsillar exudate.  Eyes:     Conjunctiva/sclera: Conjunctivae normal.     Pupils: Pupils are equal, round, and reactive to light.  Cardiovascular:     Rate and Rhythm: Normal rate and regular rhythm.     Heart sounds: Normal heart sounds.  Pulmonary:     Effort: Pulmonary effort is normal.     Breath sounds: Normal breath sounds.  Musculoskeletal:     Cervical back: Normal range of motion and neck supple.  Lymphadenopathy:     Cervical: Cervical adenopathy present.  Skin:    General: Skin is warm and dry.  Neurological:     General: No focal deficit present.     Mental Status: She is alert and oriented to person, place, and time.  Psychiatric:        Mood and Affect: Mood normal.        Behavior: Behavior normal.      UC Treatments /  Results  Labs (all labs ordered are listed, but only abnormal results are displayed) Labs Reviewed - No data to display  EKG  Radiology No results found.  Procedures Procedures (including critical care time)  Medications Ordered in UC Medications - No data to display  Initial Impression / Assessment and Plan / UC Course  I have reviewed the triage vital signs and the nursing notes.  Pertinent labs & imaging results that were available during my care of the patient were reviewed by me and considered in my medical decision making (see chart for details).     Reviewed exam and symptoms with patient.  Discussed limitations and abilities of urgent care.  Patient with large left-sided peritonsillar abscess.  Advised patient go to the ER for further evaluation/treatment such as possible imaging and/or drainage.  Patient is in agreement plan will go POV to the emergency room. Final Clinical Impressions(s) / UC Diagnoses   Final diagnoses:  Peritonsillar abscess     Discharge Instructions      Go to the ER for further eval ration and treatment of your peritonsillar abscess    ED Prescriptions   None    PDMP not reviewed this encounter.   Radford Pax, NP 01/25/24 [phone]
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
## 2024-01-26 ENCOUNTER — Other Ambulatory Visit (HOSPITAL_COMMUNITY): Payer: Self-pay

## 2024-01-26 DIAGNOSIS — J36 Peritonsillar abscess: Secondary | ICD-10-CM | POA: Diagnosis not present

## 2024-01-26 LAB — BASIC METABOLIC PANEL
Anion gap: 6 (ref 5–15)
BUN: 7 mg/dL (ref 6–20)
CO2: 24 mmol/L (ref 22–32)
Calcium: 8.9 mg/dL (ref 8.9–10.3)
Chloride: 106 mmol/L (ref 98–111)
Creatinine, Ser: 0.74 mg/dL (ref 0.44–1.00)
GFR, Estimated: >60 mL/min (ref >60)
Glucose, Bld: 148 mg/dL — ABNORMAL HIGH (ref 70–99)
Potassium: 4 mmol/L (ref 3.5–5.1)
Sodium: 136 mmol/L (ref 135–145)

## 2024-01-26 LAB — CBC
HCT: 30.5 % — ABNORMAL LOW (ref 36.0–46.0)
Hemoglobin: 9.8 g/dL — ABNORMAL LOW (ref 12.0–15.0)
MCH: 25.9 pg — ABNORMAL LOW (ref 26.0–34.0)
MCHC: 32.1 g/dL (ref 30.0–36.0)
MCV: 80.7 fL (ref 80.0–100.0)
Platelets: 357 10*3/uL (ref 150–400)
RBC: 3.78 MIL/uL — ABNORMAL LOW (ref 3.87–5.11)
RDW: 14.6 % (ref 11.5–15.5)
WBC: 17.3 10*3/uL — ABNORMAL HIGH (ref 4.0–10.5)
nRBC: 0 % (ref 0.0–0.2)

## 2024-01-26 MED ORDER — OXYCODONE HCL 5 MG PO TABS
5.0000 mg | ORAL_TABLET | ORAL | 0 refills | Status: AC | PRN
  Filled 2024-01-26: qty 10, 2d supply, fill #0

## 2024-01-26 MED ORDER — METOPROLOL TARTRATE 5 MG/5ML IV SOLN: 5.0000 mg | INTRAVENOUS | Status: DC | PRN

## 2024-01-26 MED ORDER — AMOXICILLIN-POT CLAVULANATE 875-125 MG PO TABS
1.0000 | ORAL_TABLET | Freq: Two times a day (BID) | ORAL | 0 refills | Status: AC
  Filled 2024-01-26: qty 24, 12d supply, fill #0

## 2024-01-26 MED ORDER — GLUCAGON HCL RDNA (DIAGNOSTIC) 1 MG IJ SOLR
1.0000 mg | INTRAMUSCULAR | Status: DC | PRN
Start: 2024-01-26 — End: 2024-01-26

## 2024-01-26 MED ORDER — HYDRALAZINE HCL 20 MG/ML IJ SOLN: 10.0000 mg | INTRAMUSCULAR | Status: DC | PRN

## 2024-01-26 NOTE — Hospital Course (Addendum)
Brief Narrative:  23 year old female with significant history of left arm hidradenitis comes to the hospital with tonsillitis and 1 cm peritonsillar abscess.  The symptoms started several days ago.  EDP consulted ENT who recommended IV antibiotics for now.  Eventually patient was cleared by ENT for discharge.  Will transition to p.o. Augmentin.   Assessment & Plan:  Principal Problem:   Peritonsillar abscess   Left-sided peritonsillar abscess History of left arm hidradenitis - Discussed with ENT, okay for discharge on p.o. Augmentin. - CT scan showing 2.1 X1.2X 0.7 cm peritonsillar abscess on the left side. -Strep a is negative.  DVT prophylaxis: Lovenox    Code Status: Full Code Family Communication:   Status is: Inpatient Discharge today   Subjective: Seen at bedside, really wanting to go home today. Tolerating p.o.   Examination:  General exam: Appears calm and comfortable  Respiratory system: Clear to auscultation. Respiratory effort normal. Cardiovascular system: S1 & S2 heard, RRR. No JVD, murmurs, rubs, gallops or clicks. No pedal edema. Gastrointestinal system: Abdomen is nondistended, soft and nontender. No organomegaly or masses felt. Normal bowel sounds heard. Central nervous system: Alert and oriented. No focal neurological deficits. Extremities: Symmetric 5 x 5 power. Skin: No rashes, lesions or ulcers Psychiatry: Judgement and insight appear normal. Mood & affect appropriate.

## 2024-01-26 NOTE — TOC CM/SW Note (Signed)
Transition of Care [facility]) - Inpatient Brief Assessment   Patient Details  Name: Pamela Arias MRN: [id number] Date of Birth: 08-17-2001  Transition of Care [facility] CM/SW Contact:    Howell Rucks, RN Phone Number: 01/26/2024, 12:20 PM   Clinical Narrative: No PCP on file, commercial insurance on file, pt resides in an apartment, family member on file as contact.  No TOC needs identified at this time.     Transition of Care Asessment: Insurance and Status: Insurance coverage has been reviewed Patient has primary care physician: No (no PCP on file) Home environment has been reviewed: resides in an apartment Prior level of function:: Independent Prior/Current Home Services: No current home services Social Drivers of Health Review: SDOH reviewed no interventions necessary Readmission risk has been reviewed: Yes Transition of care needs: no transition of care needs at this time

## 2024-01-26 NOTE — Plan of Care (Signed)
Discharge instructions given to the patient including medications. ?

## 2024-01-26 NOTE — Discharge Summary (Signed)
Physician Discharge Summary  XAVIER FOURNIER ZOX:[id number] DOB: 05-26-2001 DOA: 01/25/2024  PCP: Patient, No Pcp Per  Admit date: 01/25/2024 Discharge date: 01/26/2024  Admitted From: Home Disposition: Home  Recommendations for Outpatient Follow-up:  Follow up with PCP in 1-2 weeks Please obtain BMP/CBC in one week your next doctors visit.  P.o. Augmentin for 12 more days Follow-up with ENT outpatient if her condition worsens otherwise follow-up with PCP   Discharge Condition: Stable CODE STATUS: Full code Diet recommendation: Regular  Brief/Interim Summary: Brief Narrative:  23 year old female with significant history of left arm hidradenitis comes to the hospital with tonsillitis and 1 cm peritonsillar abscess.  The symptoms started several days ago.  EDP consulted ENT who recommended IV antibiotics for now.  Eventually patient was cleared by ENT for discharge.  Will transition to p.o. Augmentin.   Assessment & Plan:  Principal Problem:   Peritonsillar abscess   Left-sided peritonsillar abscess History of left arm hidradenitis - Discussed with ENT, okay for discharge on p.o. Augmentin. - CT scan showing 2.1 X1.2X 0.7 cm peritonsillar abscess on the left side. -Strep a is negative.  DVT prophylaxis: Lovenox    Code Status: Full Code Family Communication:   Status is: Inpatient Discharge today   Subjective: Seen at bedside, really wanting to go home today. Tolerating p.o.   Examination:  General exam: Appears calm and comfortable  Respiratory system: Clear to auscultation. Respiratory effort normal. Cardiovascular system: S1 & S2 heard, RRR. No JVD, murmurs, rubs, gallops or clicks. No pedal edema. Gastrointestinal system: Abdomen is nondistended, soft and nontender. No organomegaly or masses felt. Normal bowel sounds heard. Central nervous system: Alert and oriented. No focal neurological deficits. Extremities: Symmetric 5 x 5 power. Skin: No rashes, lesions or  ulcers Psychiatry: Judgement and insight appear normal. Mood & affect appropriate.    Discharge Diagnoses:  Principal Problem:   Peritonsillar abscess      Discharge Exam: Vitals:   01/26/24 0527 01/26/24 0948  BP: 116/72 (!) 141/95  Pulse: 93 73  Resp: 16   Temp: 97.9 F (36.6 C) 97.9 F (36.6 C)  SpO2: 100% 97%   Vitals:   01/25/24 2225 01/26/24 0203 01/26/24 0527 01/26/24 0948  BP: 122/77 (!) 141/72 116/72 (!) 141/95  Pulse: 65 62 93 73  Resp: 16 16 16    Temp:  98.4 F (36.9 C) 97.9 F (36.6 C) 97.9 F (36.6 C)  TempSrc:  Oral Oral Oral  SpO2: 100% 99% 100% 97%  Weight:      Height:          Discharge Instructions  Discharge Instructions     meds to beds pharmacy consult ([facility] ONLY)   Complete by: As directed       Allergies as of 01/26/2024   No Known Allergies      Medication List     STOP taking these medications    cyclobenzaprine 5 MG tablet Commonly known as: FLEXERIL   doxycycline 100 MG capsule Commonly known as: VIBRAMYCIN   naproxen 375 MG tablet Commonly known as: NAPROSYN       TAKE these medications    amoxicillin-clavulanate 875-125 MG tablet Commonly known as: AUGMENTIN Take 1 tablet by mouth 2 (two) times daily for 12 days.   oxyCODONE 5 MG immediate release tablet Commonly known as: Oxy IR/ROXICODONE Take 1 tablet (5 mg total) by mouth every 4 (four) hours as needed for severe pain (pain score 7-10) or breakthrough pain.  No Known Allergies  You were cared for by a hospitalist during your hospital stay. If you have any questions about your discharge medications or the care you received while you were in the hospital after you are discharged, you can call the unit and asked to speak with the hospitalist on call if the hospitalist that took care of you is not available. Once you are discharged, your primary care physician will handle any further medical issues. Please note that no refills for any  discharge medications will be authorized once you are discharged, as it is imperative that you return to your primary care physician (or establish a relationship with a primary care physician if you do not have one) for your aftercare needs so that they can reassess your need for medications and monitor your lab values.  You were cared for by a hospitalist during your hospital stay. If you have any questions about your discharge medications or the care you received while you were in the hospital after you are discharged, you can call the unit and asked to speak with the hospitalist on call if the hospitalist that took care of you is not available. Once you are discharged, your primary care physician will handle any further medical issues. Please note that NO REFILLS for any discharge medications will be authorized once you are discharged, as it is imperative that you return to your primary care physician (or establish a relationship with a primary care physician if you do not have one) for your aftercare needs so that they can reassess your need for medications and monitor your lab values.  Please request your Prim.MD to go over all Hospital Tests and Procedure/Radiological results at the follow up, please get all Hospital records sent to your Prim MD by signing hospital release before you go home.  Get CBC, CMP, 2 view Chest X ray checked  by Primary MD during your next visit or SNF MD in 5-7 days ( we routinely change or add medications that can affect your baseline labs and fluid status, therefore we recommend that you get the mentioned basic workup next visit with your PCP, your PCP may decide not to get them or add new tests based on their clinical decision)  On your next visit with your primary care physician please Get Medicines reviewed and adjusted.  If you experience worsening of your admission symptoms, develop shortness of breath, life threatening emergency, suicidal or homicidal thoughts you  must seek medical attention immediately by calling 911 or calling your MD immediately  if symptoms less severe.  You Must read complete instructions/literature along with all the possible adverse reactions/side effects for all the Medicines you take and that have been prescribed to you. Take any new Medicines after you have completely understood and accpet all the possible adverse reactions/side effects.   Do not drive, operate heavy machinery, perform activities at heights, swimming or participation in water activities or provide baby sitting services if your were admitted for syncope or siezures until you have seen by Primary MD or a Neurologist and advised to do so again.  Do not drive when taking Pain medications.   Procedures/Studies: CT Soft Tissue Neck W Contrast Result Date: 01/25/2024 CLINICAL DATA:  Epiglottitis or tonsillitis suspected EXAM: CT NECK WITH CONTRAST TECHNIQUE: Multidetector CT imaging of the neck was performed using the standard protocol following the bolus administration of intravenous contrast. RADIATION DOSE REDUCTION: This exam was performed according to the departmental dose-optimization program which includes automated exposure  control, adjustment of the mA and/or kV according to patient size and/or use of iterative reconstruction technique. CONTRAST:  80mL OMNIPAQUE IOHEXOL 300 MG/ML  SOLN COMPARISON:  None Available. FINDINGS: Pharynx and larynx: Epiglottis is normal in appearance. Bilateral tonsillar hypertrophy with phlegmonous change on the left with a 2.1 x 1.2 x 0.7 cm peritonsillar abscess. There is a small retropharyngeal effusion without a retropharyngeal abscess. Salivary glands: No inflammation, mass, or stone. Thyroid: Normal. Lymph nodes: Asymmetrically prominent cervical lymph nodes in the left neck, measuring up to 1.5 cm in the left 2A station. These are favored to be reactive. Vascular: Negative. Limited intracranial: Negative. Visualized orbits: Negative.  Mastoids and visualized paranasal sinuses: No middle ear or mastoid effusion. Paranasal sinuses are clear. Orbits are unremarkable. Skeleton: No acute or aggressive process. Upper chest: Negative. Other: None. IMPRESSION: 1. Findings compatible with tonsillitis and a 2.1 x 1.2 x 0.7 cm peritonsillar abscess on the left. 2. Small retropharyngeal effusion without a retropharyngeal abscess. 3. Reactive cervical lymphadenopathy. Electronically Signed   By: Lorenza Cambridge M.D.   On: 01/25/2024 12:40     The results of significant diagnostics from this hospitalization (including imaging, microbiology, ancillary and laboratory) are listed below for reference.     Microbiology: Recent Results (from the past 240 hours)  Culture, group A strep     Status: None   Collection Time: 01/23/24  7:45 PM   Specimen: Throat  Result Value Ref Range Status   Specimen Description THROAT  Final   Special Requests NONE  Final   Culture   Final    MODERATE GROUP A STREP (S.PYOGENES) ISOLATED Beta hemolytic streptococci are predictably susceptible to penicillin and other beta lactams. Susceptibility testing not routinely performed. Performed at [facility], [address], [place], [place] [address]    Report Status 01/25/2024 FINAL  Final     Labs: BNP (last 3 results) No results for input(s): "BNP" in the last 8760 hours. Basic Metabolic Panel: Recent Labs  Lab 01/25/24 1038 01/26/24 0346  NA 137 136  K 3.5 4.0  CL 104 106  CO2 25 24  GLUCOSE 97 148*  BUN 8 7  CREATININE 0.86 0.74  CALCIUM 9.3 8.9   Liver Function Tests: No results for input(s): "AST", "ALT", "ALKPHOS", "BILITOT", "PROT", "ALBUMIN" in the last 168 hours. No results for input(s): "LIPASE", "AMYLASE" in the last 168 hours. No results for input(s): "AMMONIA" in the last 168 hours. CBC: Recent Labs  Lab 01/25/24 1038 01/26/24 0346  WBC 13.1* 17.3*  NEUTROABS 10.3*  --   HGB 10.7* 9.8*  HCT 34.5* 30.5*  MCV 81.9 80.7   PLT 367 357   Cardiac Enzymes: No results for input(s): "CKTOTAL", "CKMB", "CKMBINDEX", "TROPONINI" in the last 168 hours. BNP: Invalid input(s): "POCBNP" CBG: No results for input(s): "GLUCAP" in the last 168 hours. D-Dimer No results for input(s): "DDIMER" in the last 72 hours. Hgb A1c No results for input(s): "HGBA1C" in the last 72 hours. Lipid Profile No results for input(s): "CHOL", "HDL", "LDLCALC", "TRIG", "CHOLHDL", "LDLDIRECT" in the last 72 hours. Thyroid function studies No results for input(s): "TSH", "T4TOTAL", "T3FREE", "THYROIDAB" in the last 72 hours.  Invalid input(s): "FREET3" Anemia work up No results for input(s): "VITAMINB12", "FOLATE", "FERRITIN", "TIBC", "IRON", "RETICCTPCT" in the last 72 hours. Urinalysis    Component Value Date/Time   LABSPEC 1.025 12/04/2020 1302   PHURINE 5.5 12/04/2020 1302   GLUCOSEU NEGATIVE 12/04/2020 1302   HGBUR LARGE (A) 12/04/2020 1302  BILIRUBINUR small (A) 06/25/2022 1051   KETONESUR >= (160) (A) 06/25/2022 1051   KETONESUR NEGATIVE 12/04/2020 1302   PROTEINUR =100 (A) 06/25/2022 1051   PROTEINUR NEGATIVE 12/04/2020 1302   UROBILINOGEN 0.2 06/25/2022 1051   UROBILINOGEN 0.2 12/04/2020 1302   NITRITE Negative 06/25/2022 1051   NITRITE NEGATIVE 12/04/2020 1302   LEUKOCYTESUR Small (1+) (A) 06/25/2022 1051   LEUKOCYTESUR NEGATIVE 12/04/2020 1302   Sepsis Labs Recent Labs  Lab 01/25/24 1038 01/26/24 0346  WBC 13.1* 17.3*   Microbiology Recent Results (from the past 240 hours)  Culture, group A strep     Status: None   Collection Time: 01/23/24  7:45 PM   Specimen: Throat  Result Value Ref Range Status   Specimen Description THROAT  Final   Special Requests NONE  Final   Culture   Final    MODERATE GROUP A STREP (S.PYOGENES) ISOLATED Beta hemolytic streptococci are predictably susceptible to penicillin and other beta lactams. Susceptibility testing not routinely performed. Performed at [facility]  [facility], [address], [place], [place] [address]    Report Status 01/25/2024 FINAL  Final     Time coordinating discharge:  I have spent 35 minutes face to face with the patient and on the ward discussing the patients care, assessment, plan and disposition with other care givers. >50% of the time was devoted counseling the patient about the risks and benefits of treatment/Discharge disposition and coordinating care.   SIGNED:   Miguel Rota, MD  [facility] 01/26/2024, 12:41 PM   If 7PM-7AM, please contact night-coverage

## 2024-01-26 NOTE — Progress Notes (Signed)
PROGRESS NOTE    Pamela Arias  ZOX:[id number] DOB: 20-Sep-2001 DOA: 01/25/2024 PCP: Patient, No Pcp Per    Brief Narrative:  23 year old female with significant history of left arm hidradenitis comes to the hospital with tonsillitis and 1 cm peritonsillar abscess.  The symptoms started several days ago.  EDP consulted ENT who recommended IV antibiotics for now.   Assessment & Plan:  Principal Problem:   Peritonsillar abscess   Left-sided peritonsillar abscess History of left arm hidradenitis - Seen by ENT, continue IV Unasyn. - CT scan showing 2.1 X1.2X 0.7 cm peritonsillar abscess on the left side. -Strep a is negative.  DVT prophylaxis: Lovenox    Code Status: Full Code Family Communication:   Status is: Inpatient Remains inpatient appropriate because: Continue IV antibiotics. Will wait till ENT cleared the patient for discharge.   Subjective: Seen at bedside, really wanting to go home today. Tolerating p.o.   Examination:  General exam: Appears calm and comfortable  Respiratory system: Clear to auscultation. Respiratory effort normal. Cardiovascular system: S1 & S2 heard, RRR. No JVD, murmurs, rubs, gallops or clicks. No pedal edema. Gastrointestinal system: Abdomen is nondistended, soft and nontender. No organomegaly or masses felt. Normal bowel sounds heard. Central nervous system: Alert and oriented. No focal neurological deficits. Extremities: Symmetric 5 x 5 power. Skin: No rashes, lesions or ulcers Psychiatry: Judgement and insight appear normal. Mood & affect appropriate.                Diet Orders (From admission, onward)     Start     Ordered   01/25/24 1439  Diet regular Room service appropriate? Yes; Fluid consistency: Thin  Diet effective now       Question Answer Comment  Room service appropriate? Yes   Fluid consistency: Thin      01/25/24 1438            Objective: Vitals:   01/25/24 2225 01/26/24 0203 01/26/24 0527  01/26/24 0948  BP: 122/77 (!) 141/72 116/72 (!) 141/95  Pulse: 65 62 93 73  Resp: 16 16 16    Temp:  98.4 F (36.9 C) 97.9 F (36.6 C) 97.9 F (36.6 C)  TempSrc:  Oral Oral Oral  SpO2: 100% 99% 100% 97%  Weight:      Height:        Intake/Output Summary (Last 24 hours) at 01/26/2024 1226 Last data filed at 01/26/2024 0437 Gross per 24 hour  Intake 1780.38 ml  Output --  Net 1780.38 ml   Filed Weights   01/25/24 0947  Weight: 69.5 kg    Scheduled Meds:  enoxaparin (LOVENOX) injection  40 mg Subcutaneous Q24H   Continuous Infusions:  ampicillin-sulbactam (UNASYN) IV 3 g (01/26/24 0522)    Nutritional status     Body mass index is 24.73 kg/m.  Data Reviewed:   CBC: Recent Labs  Lab 01/25/24 1038 01/26/24 0346  WBC 13.1* 17.3*  NEUTROABS 10.3*  --   HGB 10.7* 9.8*  HCT 34.5* 30.5*  MCV 81.9 80.7  PLT 367 357   Basic Metabolic Panel: Recent Labs  Lab 01/25/24 1038 01/26/24 0346  NA 137 136  K 3.5 4.0  CL 104 106  CO2 25 24  GLUCOSE 97 148*  BUN 8 7  CREATININE 0.86 0.74  CALCIUM 9.3 8.9   GFR: Estimated Creatinine Clearance: 101.5 mL/min (by C-G formula based on SCr of 0.74 mg/dL). Liver Function Tests: No results for input(s): "AST", "ALT", "ALKPHOS", "BILITOT", "PROT", "ALBUMIN" in  the last 168 hours. No results for input(s): "LIPASE", "AMYLASE" in the last 168 hours. No results for input(s): "AMMONIA" in the last 168 hours. Coagulation Profile: No results for input(s): "INR", "PROTIME" in the last 168 hours. Cardiac Enzymes: No results for input(s): "CKTOTAL", "CKMB", "CKMBINDEX", "TROPONINI" in the last 168 hours. BNP (last 3 results) No results for input(s): "PROBNP" in the last 8760 hours. HbA1C: No results for input(s): "HGBA1C" in the last 72 hours. CBG: No results for input(s): "GLUCAP" in the last 168 hours. Lipid Profile: No results for input(s): "CHOL", "HDL", "LDLCALC", "TRIG", "CHOLHDL", "LDLDIRECT" in the last 72  hours. Thyroid Function Tests: No results for input(s): "TSH", "T4TOTAL", "FREET4", "T3FREE", "THYROIDAB" in the last 72 hours. Anemia Panel: No results for input(s): "VITAMINB12", "FOLATE", "FERRITIN", "TIBC", "IRON", "RETICCTPCT" in the last 72 hours. Sepsis Labs: No results for input(s): "PROCALCITON", "LATICACIDVEN" in the last 168 hours.  Recent Results (from the past 240 hours)  Culture, group A strep     Status: None   Collection Time: 01/23/24  7:45 PM   Specimen: Throat  Result Value Ref Range Status   Specimen Description THROAT  Final   Special Requests NONE  Final   Culture   Final    MODERATE GROUP A STREP (S.PYOGENES) ISOLATED Beta hemolytic streptococci are predictably susceptible to penicillin and other beta lactams. Susceptibility testing not routinely performed. Performed at [facility], [address], [place], [place] [address]    Report Status 01/25/2024 FINAL  Final         Radiology Studies: CT Soft Tissue Neck W Contrast Result Date: 01/25/2024 CLINICAL DATA:  Epiglottitis or tonsillitis suspected EXAM: CT NECK WITH CONTRAST TECHNIQUE: Multidetector CT imaging of the neck was performed using the standard protocol following the bolus administration of intravenous contrast. RADIATION DOSE REDUCTION: This exam was performed according to the departmental dose-optimization program which includes automated exposure control, adjustment of the mA and/or kV according to patient size and/or use of iterative reconstruction technique. CONTRAST:  80mL OMNIPAQUE IOHEXOL 300 MG/ML  SOLN COMPARISON:  None Available. FINDINGS: Pharynx and larynx: Epiglottis is normal in appearance. Bilateral tonsillar hypertrophy with phlegmonous change on the left with a 2.1 x 1.2 x 0.7 cm peritonsillar abscess. There is a small retropharyngeal effusion without a retropharyngeal abscess. Salivary glands: No inflammation, mass, or stone. Thyroid: Normal. Lymph nodes: Asymmetrically  prominent cervical lymph nodes in the left neck, measuring up to 1.5 cm in the left 2A station. These are favored to be reactive. Vascular: Negative. Limited intracranial: Negative. Visualized orbits: Negative. Mastoids and visualized paranasal sinuses: No middle ear or mastoid effusion. Paranasal sinuses are clear. Orbits are unremarkable. Skeleton: No acute or aggressive process. Upper chest: Negative. Other: None. IMPRESSION: 1. Findings compatible with tonsillitis and a 2.1 x 1.2 x 0.7 cm peritonsillar abscess on the left. 2. Small retropharyngeal effusion without a retropharyngeal abscess. 3. Reactive cervical lymphadenopathy. Electronically Signed   By: Lorenza Cambridge M.D.   On: 01/25/2024 12:40           LOS: 1 day   Time spent= 35 mins    Miguel Rota, MD [facility]  If 7PM-7AM, please contact night-coverage  01/26/2024, 12:26 PM

## 2024-01-26 NOTE — Plan of Care (Signed)
